# Patient Record
Sex: Female | Born: 1935 | Race: White | Hispanic: No | Smoking: Former smoker
Health system: Southern US, Community
[De-identification: ages and names within clinical notes are randomized; demographics above are authoritative.]

## PROBLEM LIST (undated history)

## (undated) DIAGNOSIS — I10 Essential (primary) hypertension: Secondary | ICD-10-CM

## (undated) HISTORY — PX: CHOLECYSTECTOMY: SHX55

## (undated) HISTORY — PX: ABDOMINAL HYSTERECTOMY: SHX81

## (undated) HISTORY — PX: APPENDECTOMY: SHX54

---

## 2004-05-03 ENCOUNTER — Ambulatory Visit: Payer: Self-pay | Admitting: Internal Medicine

## 2005-06-14 ENCOUNTER — Ambulatory Visit: Payer: Self-pay | Admitting: Internal Medicine

## 2006-06-20 ENCOUNTER — Ambulatory Visit: Payer: Self-pay | Admitting: Internal Medicine

## 2008-05-21 ENCOUNTER — Inpatient Hospital Stay: Payer: Self-pay | Admitting: Internal Medicine

## 2017-07-27 ENCOUNTER — Emergency Department: Payer: Medicare Other

## 2017-07-27 ENCOUNTER — Observation Stay
Admission: EM | Admit: 2017-07-27 | Discharge: 2017-07-30 | Disposition: A | Discharge status: Home / Self Care | Payer: Medicare Other | Attending: Internal Medicine | Admitting: Internal Medicine

## 2017-07-27 DIAGNOSIS — D72829 Elevated white blood cell count, unspecified: Secondary | ICD-10-CM | POA: Insufficient documentation

## 2017-07-27 DIAGNOSIS — R55 Syncope and collapse: Principal | ICD-10-CM

## 2017-07-27 DIAGNOSIS — Z87891 Personal history of nicotine dependence: Secondary | ICD-10-CM | POA: Insufficient documentation

## 2017-07-27 DIAGNOSIS — Z9181 History of falling: Secondary | ICD-10-CM | POA: Diagnosis not present

## 2017-07-27 DIAGNOSIS — Z880 Allergy status to penicillin: Secondary | ICD-10-CM | POA: Insufficient documentation

## 2017-07-27 DIAGNOSIS — R531 Weakness: Secondary | ICD-10-CM | POA: Diagnosis not present

## 2017-07-27 DIAGNOSIS — I10 Essential (primary) hypertension: Secondary | ICD-10-CM

## 2017-07-27 DIAGNOSIS — S0121XA Laceration without foreign body of nose, initial encounter: Secondary | ICD-10-CM | POA: Diagnosis not present

## 2017-07-27 DIAGNOSIS — S022XXA Fracture of nasal bones, initial encounter for closed fracture: Secondary | ICD-10-CM | POA: Diagnosis present

## 2017-07-27 DIAGNOSIS — E876 Hypokalemia: Secondary | ICD-10-CM | POA: Insufficient documentation

## 2017-07-27 DIAGNOSIS — S0003XA Contusion of scalp, initial encounter: Secondary | ICD-10-CM | POA: Diagnosis not present

## 2017-07-27 DIAGNOSIS — Z7982 Long term (current) use of aspirin: Secondary | ICD-10-CM | POA: Insufficient documentation

## 2017-07-27 DIAGNOSIS — W108XXA Fall (on) (from) other stairs and steps, initial encounter: Secondary | ICD-10-CM | POA: Insufficient documentation

## 2017-07-27 DIAGNOSIS — R2681 Unsteadiness on feet: Secondary | ICD-10-CM | POA: Insufficient documentation

## 2017-07-27 DIAGNOSIS — Y92018 Other place in single-family (private) house as the place of occurrence of the external cause: Secondary | ICD-10-CM | POA: Diagnosis not present

## 2017-07-27 DIAGNOSIS — I4519 Other right bundle-branch block: Secondary | ICD-10-CM | POA: Diagnosis not present

## 2017-07-27 DIAGNOSIS — W19XXXA Unspecified fall, initial encounter: Secondary | ICD-10-CM | POA: Diagnosis present

## 2017-07-27 HISTORY — DX: Essential (primary) hypertension: I10

## 2017-07-27 LAB — BASIC METABOLIC PANEL
Anion gap: 8 (ref 5–15)
BUN: 13 mg/dL (ref 6–20)
CALCIUM: 8.4 mg/dL — AB (ref 8.9–10.3)
CO2: 27 mmol/L (ref 22–32)
Chloride: 103 mmol/L (ref 101–111)
Creatinine, Ser: 0.93 mg/dL (ref 0.44–1.00)
GFR calc Af Amer: 60 mL/min — ABNORMAL LOW (ref >60)
GFR calc non Af Amer: 51 mL/min — ABNORMAL LOW (ref >60)
GLUCOSE: 173 mg/dL — AB (ref 65–99)
Potassium: 3.5 mmol/L (ref 3.5–5.1)
Sodium: 138 mmol/L (ref 135–145)

## 2017-07-27 LAB — CBC
HCT: 39.3 % (ref 35.0–47.0)
Hemoglobin: 13.1 g/dL (ref 12.0–16.0)
MCH: 31.5 pg (ref 26.0–34.0)
MCHC: 33.4 g/dL (ref 32.0–36.0)
MCV: 94.4 fL (ref 80.0–100.0)
PLATELETS: 190 10*3/uL (ref 150–440)
RBC: 4.16 MIL/uL (ref 3.80–5.20)
RDW: 14.5 % (ref 11.5–14.5)
WBC: 15.1 10*3/uL — AB (ref 3.6–11.0)

## 2017-07-27 LAB — TROPONIN I: Troponin I: <0.03 ng/mL (ref <0.03)

## 2017-07-27 LAB — GLUCOSE, CAPILLARY: Glucose-Capillary: 139 mg/dL — ABNORMAL HIGH (ref 65–99)

## 2017-07-27 MED ORDER — LABETALOL HCL 100 MG PO TABS: 50.0000 mg | ORAL_TABLET | Freq: Two times a day (BID) | ORAL | 0 refills | Status: DC

## 2017-07-27 MED ORDER — ONDANSETRON HCL 4 MG PO TABS: 4.0000 mg | ORAL_TABLET | Freq: Four times a day (QID) | ORAL | Status: DC | PRN

## 2017-07-27 MED ORDER — ACETAMINOPHEN 650 MG RE SUPP: 650.0000 mg | Freq: Four times a day (QID) | RECTAL | Status: DC | PRN

## 2017-07-27 MED ORDER — DOXYCYCLINE HYCLATE 100 MG PO CAPS: 100.0000 mg | ORAL_CAPSULE | Freq: Two times a day (BID) | ORAL | 0 refills | Status: DC

## 2017-07-27 MED ORDER — ONDANSETRON HCL 4 MG/2ML IJ SOLN: 4.0000 mg | Freq: Four times a day (QID) | INTRAMUSCULAR | Status: DC | PRN

## 2017-07-27 MED ORDER — LABETALOL HCL 100 MG PO TABS
50.0000 mg | ORAL_TABLET | Freq: Once | ORAL | Status: AC
  Administered 2017-07-27: 50 mg via ORAL
  Filled 2017-07-27: qty 0.5

## 2017-07-27 MED ORDER — HYDRALAZINE HCL 20 MG/ML IJ SOLN
10.0000 mg | Freq: Four times a day (QID) | INTRAMUSCULAR | Status: DC | PRN
Start: 2017-07-27 — End: 2017-07-27

## 2017-07-27 MED ORDER — TETANUS-DIPHTH-ACELL PERTUSSIS 5-2.5-18.5 LF-MCG/0.5 IM SUSP
0.5000 mL | Freq: Once | INTRAMUSCULAR | Status: AC
  Administered 2017-07-27: 0.5 mL via INTRAMUSCULAR
  Filled 2017-07-27: qty 0.5

## 2017-07-27 MED ORDER — AMLODIPINE BESYLATE 5 MG PO TABS
2.5000 mg | ORAL_TABLET | Freq: Every day | ORAL | Status: DC
  Administered 2017-07-28: 2.5 mg via ORAL
  Filled 2017-07-27: qty 1

## 2017-07-27 MED ORDER — VITAMIN D 1000 UNITS PO TABS
1000.0000 [IU] | ORAL_TABLET | Freq: Every day | ORAL | Status: DC
  Administered 2017-07-28 – 2017-07-30 (×3): 1000 [IU] via ORAL
  Filled 2017-07-27 (×3): qty 1

## 2017-07-27 MED ORDER — TRAMADOL HCL 50 MG PO TABS: 50.0000 mg | ORAL_TABLET | Freq: Four times a day (QID) | ORAL | Status: DC | PRN

## 2017-07-27 MED ORDER — ENOXAPARIN SODIUM 40 MG/0.4ML ~~LOC~~ SOLN
40.0000 mg | SUBCUTANEOUS | Status: DC
  Administered 2017-07-27 – 2017-07-29 (×3): 40 mg via SUBCUTANEOUS
  Filled 2017-07-27 (×3): qty 0.4

## 2017-07-27 MED ORDER — SODIUM CHLORIDE 0.9 % IV BOLUS (SEPSIS)
500.0000 mL | Freq: Once | INTRAVENOUS | Status: AC
  Administered 2017-07-27: 500 mL via INTRAVENOUS

## 2017-07-27 MED ORDER — LIDOCAINE-EPINEPHRINE (PF) 1 %-1:200000 IJ SOLN
INTRAMUSCULAR | Status: AC
  Filled 2017-07-27: qty 30

## 2017-07-27 MED ORDER — SODIUM CHLORIDE 0.9 % IV SOLN
INTRAVENOUS | Status: AC
  Administered 2017-07-27: 75 mL/h via INTRAVENOUS

## 2017-07-27 MED ORDER — ONDANSETRON 4 MG PO TBDP
ORAL_TABLET | ORAL | Status: AC
  Administered 2017-07-27: 4 mg
  Filled 2017-07-27: qty 1

## 2017-07-27 MED ORDER — ONDANSETRON HCL 4 MG/2ML IJ SOLN
4.0000 mg | Freq: Once | INTRAMUSCULAR | Status: AC
  Administered 2017-07-27: 4 mg via INTRAVENOUS
  Filled 2017-07-27: qty 2

## 2017-07-27 MED ORDER — ACETAMINOPHEN 325 MG PO TABS
650.0000 mg | ORAL_TABLET | Freq: Four times a day (QID) | ORAL | Status: DC | PRN
  Administered 2017-07-27: 650 mg via ORAL
  Filled 2017-07-27: qty 2

## 2017-07-27 MED ORDER — ADULT MULTIVITAMIN W/MINERALS CH
1.0000 | ORAL_TABLET | Freq: Every day | ORAL | Status: DC
  Administered 2017-07-28 – 2017-07-30 (×3): 1 via ORAL
  Filled 2017-07-27 (×3): qty 1

## 2017-07-27 MED ORDER — HYDRALAZINE HCL 20 MG/ML IJ SOLN
5.0000 mg | Freq: Four times a day (QID) | INTRAMUSCULAR | Status: DC | PRN
  Administered 2017-07-28 – 2017-07-29 (×2): 5 mg via INTRAVENOUS
  Filled 2017-07-27 (×2): qty 1

## 2017-07-27 MED ORDER — POLYETHYLENE GLYCOL 3350 17 G PO PACK: 17.0000 g | PACK | Freq: Every day | ORAL | Status: DC | PRN

## 2017-07-27 NOTE — H&P (Signed)
[facility] [place] NAME: Kaitlyn Bauer    MR#:  [id number]  DATE OF BIRTH:  [birth year]  DATE OF ADMISSION:  07/27/2017  PRIMARY CARE PHYSICIAN: Kirk Ruths, MD   REQUESTING/REFERRING PHYSICIAN: Dr. Rudene Re  CHIEF COMPLAINT:   Chief Complaint  Patient presents with  . Fall    HISTORY OF PRESENT ILLNESS:  Kaitlyn Bauer  is a 84 y.o. female with a known history of hypertension not on any medications brought to the hospital secondary to a fall at home. Patient in spite of for his age as being very independent, living by herself. Ambulates fine, was driving the car up until a month ago. Today she was getting out off her porch steps and tripped and had a mechanical fall on her face. No recent fevers or chills. Hasn't been sick recently. No cough or congestion. She was diagnosed with hypertension but has not been on any medications for a while. She was put on one medicine in the past by her PCP but stopped taking it as it was making her feel weak. During her last PCP visit about a couple of years ago her blood pressure systolic was in 381O. Patient denies any headaches or dizziness. Here her systolic blood pressure was in the 200s and she received to oral dose of only 50 mg of labetalol and she was being discharged from ER- as soon as she stood up her BP dropped to 17'P systolic and she almost had a syncopal episode. Now she is alert and back to baseline. Her BP is now back to 102'H systolic. She has a large frontal hematoma and also nasal bridge tear which is sutured.  PAST MEDICAL HISTORY:   Past Medical History:  Diagnosis Date  . Hypertension     PAST SURGICAL HISTORY:   Past Surgical History:  Procedure Laterality Date  . ABDOMINAL HYSTERECTOMY    . APPENDECTOMY    . CHOLECYSTECTOMY      SOCIAL HISTORY:   Social History   Tobacco Use  . Smoking status: Former Research scientist (life sciences)  . Smokeless tobacco: Never Used  Substance  Use Topics  . Alcohol use: No    Frequency: Never    FAMILY HISTORY:   Family History  Family history unknown: Yes    DRUG ALLERGIES:   Allergies  Allergen Reactions  . Penicillins Hives    REVIEW OF SYSTEMS:   Review of Systems  Constitutional: Positive for malaise/fatigue. Negative for chills, fever and weight loss.  HENT: Negative for ear discharge, ear pain, hearing loss, nosebleeds and tinnitus.   Eyes: Positive for blurred vision. Negative for double vision and photophobia.  Respiratory: Negative for cough, hemoptysis, shortness of breath and wheezing.   Cardiovascular: Negative for chest pain, palpitations, orthopnea and leg swelling.  Gastrointestinal: Negative for abdominal pain, constipation, diarrhea, heartburn, melena, nausea and vomiting.  Genitourinary: Negative for dysuria, frequency, hematuria and urgency.  Musculoskeletal: Positive for falls and myalgias. Negative for back pain and neck pain.  Skin: Negative for rash.  Neurological: Negative for dizziness, tingling, tremors, sensory change, speech change, focal weakness and headaches.  Endo/Heme/Allergies: Does not bruise/bleed easily.  Psychiatric/Behavioral: Negative for depression.    MEDICATIONS AT HOME:   Prior to Admission medications   Medication Sig Start Date End Date Taking? Authorizing Provider  aspirin 325 MG EC tablet Take 325 mg by mouth as needed (arthritis).   Yes [provider]  Cholecalciferol (VITAMIN D-1000 MAX ST) 1000 units  tablet Take 1,000 Units by mouth daily.   Yes [provider]  Multiple Vitamin (MULTI-VITAMINS) TABS Take 1 tablet by mouth daily.   Yes [provider]  doxycycline (VIBRAMYCIN) 100 MG capsule Take 1 capsule (100 mg total) by mouth 2 (two) times daily. 07/27/17   Delman Kitten, MD      VITAL SIGNS:  Blood pressure (!) 174/78, pulse 89, temperature 97.7 F (36.5 C), temperature source Oral, resp. rate 17, height 5\' 4"  (1.626 m), weight  63.5 kg (140 lb), SpO2 96 %.  PHYSICAL EXAMINATION:   Physical Exam  GENERAL:  84 y.o.-year-old elderly patient lying in the bed with no acute distress.  EYES: Pupils equal, round, reactive to light and accommodation. No scleral icterus. Extraocular muscles intact.  HEENT: facial trauma- significant bruising and swelling on the face, around the eyes. Sutures on the nasal bridge. Oropharynx and nasopharynx clear.  NECK:  Supple, no jugular venous distention. No thyroid enlargement, no tenderness.  LUNGS: Normal breath sounds bilaterally, no wheezing, rales,rhonchi or crepitation. No use of accessory muscles of respiration.  CARDIOVASCULAR: S1, S2 normal. No  rubs, or gallops. 2/6 systolic murmur present. ABDOMEN: Soft, nontender, nondistended. Bowel sounds present. No organomegaly or mass.  EXTREMITIES: No pedal edema, cyanosis, or clubbing.  NEUROLOGIC: Cranial nerves II through XII are intact. Muscle strength 5/5 in all extremities. Sensation intact. Gait not checked.  PSYCHIATRIC: The patient is alert and oriented x 3.  SKIN: No obvious rash, lesion, or ulcer.   LABORATORY PANEL:   CBC Recent Labs  Lab 07/27/17 1534  WBC 15.1*  HGB 13.1  HCT 39.3  PLT 190   ------------------------------------------------------------------------------------------------------------------  Chemistries  Recent Labs  Lab 07/27/17 1534  NA 138  K 3.5  CL 103  CO2 27  GLUCOSE 173*  BUN 13  CREATININE 0.93  CALCIUM 8.4*   ------------------------------------------------------------------------------------------------------------------  Cardiac Enzymes Recent Labs  Lab 07/27/17 1534  TROPONINI <0.03   ------------------------------------------------------------------------------------------------------------------  RADIOLOGY:  Ct Head Wo Contrast  Result Date: 07/27/2017 CLINICAL DATA:  Fall.  Head injury EXAM: CT HEAD WITHOUT CONTRAST CT MAXILLOFACIAL WITHOUT CONTRAST CT CERVICAL  SPINE WITHOUT CONTRAST TECHNIQUE: Multidetector CT imaging of the head, cervical spine, and maxillofacial structures were performed using the standard protocol without intravenous contrast. Multiplanar CT image reconstructions of the cervical spine and maxillofacial structures were also generated. COMPARISON:  None. FINDINGS: CT HEAD FINDINGS Brain: Generalized atrophy with mild chronic microvascular ischemia. Ventricle size normal. Benign appearing calcification right parietal white matter possibly related to prior infarct or infection. Negative for acute infarct, hemorrhage, or mass Vascular: Negative for hyperdense vessel Skull: Negative for skull fracture. Extensive scalp hematoma anteriorly. Other: None CT MAXILLOFACIAL FINDINGS Osseous: Mildly displaced nasal bone fracture. Nasal septum intact. No other facial fracture identified. Degenerative changes in the TMJ bilaterally. Orbits: Bilateral lens replacement.  Negative for mass lesion. Sinuses: Mild mucosal edema in the maxillary sinus bilaterally without air-fluid level Soft tissues: Extensive scalp hematoma in the frontal region bilaterally. Nasal laceration. CT CERVICAL SPINE FINDINGS Alignment: 3 mm anterolisthesis C6-7.  Mild anterolisthesis C4-5 Skull base and vertebrae: No fracture identified. Soft tissues and spinal canal: Negative Disc levels: Moderate facet degeneration bilaterally C3 through C7. Mild disc degeneration without spinal stenosis. Upper chest: Negative Other: None IMPRESSION: 1. No acute intracranial abnormality. Atrophy and chronic microvascular ischemia. Relatively large frontal scalp hematoma 2. Mildly displaced nasal bone fracture.  No other facial fracture. 3. Cervical spondylosis without fracture Electronically Signed   By: Franchot Gallo  M.D.   On: 07/27/2017 11:01   Ct Cervical Spine Wo Contrast  Result Date: 07/27/2017 CLINICAL DATA:  Fall.  Head injury EXAM: CT HEAD WITHOUT CONTRAST CT MAXILLOFACIAL WITHOUT CONTRAST CT  CERVICAL SPINE WITHOUT CONTRAST TECHNIQUE: Multidetector CT imaging of the head, cervical spine, and maxillofacial structures were performed using the standard protocol without intravenous contrast. Multiplanar CT image reconstructions of the cervical spine and maxillofacial structures were also generated. COMPARISON:  None. FINDINGS: CT HEAD FINDINGS Brain: Generalized atrophy with mild chronic microvascular ischemia. Ventricle size normal. Benign appearing calcification right parietal white matter possibly related to prior infarct or infection. Negative for acute infarct, hemorrhage, or mass Vascular: Negative for hyperdense vessel Skull: Negative for skull fracture. Extensive scalp hematoma anteriorly. Other: None CT MAXILLOFACIAL FINDINGS Osseous: Mildly displaced nasal bone fracture. Nasal septum intact. No other facial fracture identified. Degenerative changes in the TMJ bilaterally. Orbits: Bilateral lens replacement.  Negative for mass lesion. Sinuses: Mild mucosal edema in the maxillary sinus bilaterally without air-fluid level Soft tissues: Extensive scalp hematoma in the frontal region bilaterally. Nasal laceration. CT CERVICAL SPINE FINDINGS Alignment: 3 mm anterolisthesis C6-7.  Mild anterolisthesis C4-5 Skull base and vertebrae: No fracture identified. Soft tissues and spinal canal: Negative Disc levels: Moderate facet degeneration bilaterally C3 through C7. Mild disc degeneration without spinal stenosis. Upper chest: Negative Other: None IMPRESSION: 1. No acute intracranial abnormality. Atrophy and chronic microvascular ischemia. Relatively large frontal scalp hematoma 2. Mildly displaced nasal bone fracture.  No other facial fracture. 3. Cervical spondylosis without fracture Electronically Signed   By: Franchot Gallo M.D.   On: 07/27/2017 11:01   Ct Maxillofacial Wo Contrast  Result Date: 07/27/2017 CLINICAL DATA:  Fall.  Head injury EXAM: CT HEAD WITHOUT CONTRAST CT MAXILLOFACIAL WITHOUT CONTRAST  CT CERVICAL SPINE WITHOUT CONTRAST TECHNIQUE: Multidetector CT imaging of the head, cervical spine, and maxillofacial structures were performed using the standard protocol without intravenous contrast. Multiplanar CT image reconstructions of the cervical spine and maxillofacial structures were also generated. COMPARISON:  None. FINDINGS: CT HEAD FINDINGS Brain: Generalized atrophy with mild chronic microvascular ischemia. Ventricle size normal. Benign appearing calcification right parietal white matter possibly related to prior infarct or infection. Negative for acute infarct, hemorrhage, or mass Vascular: Negative for hyperdense vessel Skull: Negative for skull fracture. Extensive scalp hematoma anteriorly. Other: None CT MAXILLOFACIAL FINDINGS Osseous: Mildly displaced nasal bone fracture. Nasal septum intact. No other facial fracture identified. Degenerative changes in the TMJ bilaterally. Orbits: Bilateral lens replacement.  Negative for mass lesion. Sinuses: Mild mucosal edema in the maxillary sinus bilaterally without air-fluid level Soft tissues: Extensive scalp hematoma in the frontal region bilaterally. Nasal laceration. CT CERVICAL SPINE FINDINGS Alignment: 3 mm anterolisthesis C6-7.  Mild anterolisthesis C4-5 Skull base and vertebrae: No fracture identified. Soft tissues and spinal canal: Negative Disc levels: Moderate facet degeneration bilaterally C3 through C7. Mild disc degeneration without spinal stenosis. Upper chest: Negative Other: None IMPRESSION: 1. No acute intracranial abnormality. Atrophy and chronic microvascular ischemia. Relatively large frontal scalp hematoma 2. Mildly displaced nasal bone fracture.  No other facial fracture. 3. Cervical spondylosis without fracture Electronically Signed   By: Franchot Gallo M.D.   On: 07/27/2017 11:01    EKG:   Orders placed or performed during the hospital encounter of 07/27/17  . ED EKG  . ED EKG  . EKG 12-Lead  . EKG 12-Lead    IMPRESSION  AND PLAN:   Kaitlyn Bauer  is a 84 y.o. female with a known history  of hypertension not on any medications brought to the hospital secondary to a fall at home.  1. Near syncope- in ER, due to drop in BP - gentle hydration, has had high blood pressure and was not on any medications at home. -Low-dose Norvasc started. Monitor and IV low-dose hydralazine as needed -Orthostatic blood pressure changes and carotid ultrasound. -Physical therapy consulted  2. Fall-mechanical fall. Sutures on nasal bridge  -Relatively large frontal scalp hematoma - no vision changes. Hold aspirin. - PT consult  3. Leukocytosis- chronic based on PCP notes Also from the fall Check UA  4. DVT prophylaxis- on lovenox   All the records are reviewed and case discussed with ED provider. Management plans discussed with the patient, family and they are in agreement.  CODE STATUS: Full Code  TOTAL TIME TAKING CARE OF THIS PATIENT: 50 minutes.    Gladstone Lighter M.D on 07/27/2017 at 6:12 PM  Between 7am to 6pm - Pager - [phone]  After 6pm go to [email or web address] - password [email or web address] [facility]  Office  [phone]  CC: Primary care physician; Kirk Ruths, MD

## 2017-07-27 NOTE — ED Triage Notes (Signed)
Patient arrived from home via [facility] c/o fall. Per EMS patient tripped in drive way and fell face first resulting in a laceration on bridge of nose that is actively bleeding. Patient denies LOC. Patient alert and oriented, in no acute distress noted.

## 2017-07-27 NOTE — Discharge Instructions (Addendum)
You were seen in the Emergency Department (ED) today for a head injury.  Based on your evaluation, you may have sustained a concussion (or bruise) to your brain.  If you had a CT scan done, it did not show any evidence of serious injury or bleeding.    Symptoms to expect from a concussion include nausea, mild to moderate headache, difficulty concentrating or sleeping, and mild lightheadedness.  These symptoms should improve over the next few days to weeks, but it may take many weeks before you feel back to normal.  Return to the emergency department or follow-up with your primary care doctor if your symptoms are not improving over this time.  Signs of a more serious head injury include vomiting, severe headache, excessive sleepiness or confusion, and weakness or numbness in your face, arms or legs.  Return immediately to the Emergency Department if you experience any of these more concerning symptoms.

## 2017-07-27 NOTE — ED Provider Notes (Addendum)
[facility] Emergency Department Provider Note   ____________________________________________   First MD Initiated Contact with Patient 07/27/17 [phone]     (approximate)  I have reviewed the triage vital signs and the nursing notes.   HISTORY  Chief Complaint Fall    HPI Kaitlyn Bauer is a 84 y.o. female almost no past medical history.  Reported as healthy, takes aspirin occasionally, but not daily.  Also multivitamins  Patient was walking today back on her steps to get the paper when she tripped on 1 of the steps fell forward striking her face on a concrete.  She reports that she was walking, missed the step and fell directly onto her face.  Denies injury except for her bleeding swelling and pain about the middle of her face and her nose.  EMS reports she had notable bleeding from a laceration over the middle of the upper nose.  No alteration in mental status.  Alert and oriented.  No neck pain.  Denies other injuries.  No chest pain or trouble breathing.  Reports a moderate to severe discomfort and aching pain over the bridge of her nose also her forehead.  Denies visual changes.  No eye pain.  History reviewed. No pertinent past medical history.  There are no active problems to display for this patient.   Past Surgical History:  Procedure Laterality Date  . ABDOMINAL HYSTERECTOMY    . APPENDECTOMY    . CHOLECYSTECTOMY      Prior to Admission medications   Medication Sig Start Date End Date Taking? Authorizing Provider  aspirin 325 MG EC tablet Take 325 mg by mouth as needed (arthritis).   Yes [provider]  Cholecalciferol (VITAMIN D-1000 MAX ST) 1000 units tablet Take 1,000 Units by mouth daily.   Yes [provider]  Multiple Vitamin (MULTI-VITAMINS) TABS Take 1 tablet by mouth daily.   Yes [provider]  labetalol (NORMODYNE) 100 MG tablet Take 0.5 tablets (50 mg total) by mouth 2 (two) times daily. 07/27/17    Delman Kitten, MD  Occasionally aspirin  Allergies Penicillins  Family History  Family history unknown: Yes    Social History Social History   Tobacco Use  . Smoking status: Former Research scientist (life sciences)  . Smokeless tobacco: Never Used  Substance Use Topics  . Alcohol use: No    Frequency: Never  . Drug use: No  Does not smoke drink or use drugs  Review of Systems Constitutional: No fever/chills Eyes: No visual changes. ENT: No sore throat.  See HPI.  Denies pain in the jaw.  Able to open and close the mouth without difficulty.  Denies dental injury.  Denies neck pain or trouble breathing. Cardiovascular: Denies chest pain. Respiratory: Denies shortness of breath. Gastrointestinal: No abdominal pain.  No nausea, no vomiting.   Genitourinary: Negative for dysuria. Musculoskeletal: Negative for back pain. Skin: Negative for rash. Neurological: Negative for headaches, focal weakness or numbness.  Does reports she feels very sore across the front of her face though.    ____________________________________________   PHYSICAL EXAM:  VITAL SIGNS: ED Triage Vitals  Enc Vitals Group     BP      Pulse      Resp      Temp      Temp src      SpO2      Weight      Height      Head Circumference      Peak Flow  Pain Score      Pain Loc      Pain Edu?      Excl. in [place]?     Constitutional: Alert and oriented.  Mildly ill-appearing, appears anxious. Eyes: Conjunctivae are normal. Head: Atraumatic over the posterior lateral aspects of the head, but the anterior face shows a moderate hematoma overlying the mid frontal region, she has bilateral raccoon eyes developing but is able to open and close the eyelids at this time and the conjunctiva appear normal with extraocular movements intact.  She has a small laceration over the bridge of the nose with small arteriolar bleeding.  No septal hematoma.  No bleeding noted from within the nares.  Oropharynx is clear, no loose teeth.  Able to  open and close the jaw without difficulty. Nose: No congestion/rhinnorhea. Mouth/Throat: Mucous membranes are moist. Neck: No stridor.  No midline cervical tenderness. Cardiovascular: Normal rate, regular rhythm. Grossly normal heart sounds.  Good peripheral circulation. Respiratory: Normal respiratory effort.  No retractions. Lungs CTAB. Gastrointestinal: Soft and nontender. No distention. Musculoskeletal: No lower extremity tenderness nor edema. Neurologic:  Normal speech and language. No gross focal neurologic deficits are appreciated.  Skin:  Skin is warm, dry and intact. No rash noted. Psychiatric: Mood and affect are normal. Speech and behavior are normal.  ____________________________________________   LABS (all labs ordered are listed, but only abnormal results are displayed)  Labs Reviewed  GLUCOSE, CAPILLARY - Abnormal; Notable for the following components:      Result Value   Glucose-Capillary 139 (*)    All other components within normal limits  CBG MONITORING, ED   ____________________________________________  EKG   ____________________________________________  RADIOLOGY  Ct Head Wo Contrast  Result Date: 07/27/2017 CLINICAL DATA:  Fall.  Head injury EXAM: CT HEAD WITHOUT CONTRAST CT MAXILLOFACIAL WITHOUT CONTRAST CT CERVICAL SPINE WITHOUT CONTRAST TECHNIQUE: Multidetector CT imaging of the head, cervical spine, and maxillofacial structures were performed using the standard protocol without intravenous contrast. Multiplanar CT image reconstructions of the cervical spine and maxillofacial structures were also generated. COMPARISON:  None. FINDINGS: CT HEAD FINDINGS Brain: Generalized atrophy with mild chronic microvascular ischemia. Ventricle size normal. Benign appearing calcification right parietal white matter possibly related to prior infarct or infection. Negative for acute infarct, hemorrhage, or mass Vascular: Negative for hyperdense vessel Skull: Negative for  skull fracture. Extensive scalp hematoma anteriorly. Other: None CT MAXILLOFACIAL FINDINGS Osseous: Mildly displaced nasal bone fracture. Nasal septum intact. No other facial fracture identified. Degenerative changes in the TMJ bilaterally. Orbits: Bilateral lens replacement.  Negative for mass lesion. Sinuses: Mild mucosal edema in the maxillary sinus bilaterally without air-fluid level Soft tissues: Extensive scalp hematoma in the frontal region bilaterally. Nasal laceration. CT CERVICAL SPINE FINDINGS Alignment: 3 mm anterolisthesis C6-7.  Mild anterolisthesis C4-5 Skull base and vertebrae: No fracture identified. Soft tissues and spinal canal: Negative Disc levels: Moderate facet degeneration bilaterally C3 through C7. Mild disc degeneration without spinal stenosis. Upper chest: Negative Other: None IMPRESSION: 1. No acute intracranial abnormality. Atrophy and chronic microvascular ischemia. Relatively large frontal scalp hematoma 2. Mildly displaced nasal bone fracture.  No other facial fracture. 3. Cervical spondylosis without fracture Electronically Signed   By: Franchot Gallo M.D.   On: 07/27/2017 11:01   Ct Cervical Spine Wo Contrast  Result Date: 07/27/2017 CLINICAL DATA:  Fall.  Head injury EXAM: CT HEAD WITHOUT CONTRAST CT MAXILLOFACIAL WITHOUT CONTRAST CT CERVICAL SPINE WITHOUT CONTRAST TECHNIQUE: Multidetector CT imaging of the head, cervical spine, and maxillofacial  structures were performed using the standard protocol without intravenous contrast. Multiplanar CT image reconstructions of the cervical spine and maxillofacial structures were also generated. COMPARISON:  None. FINDINGS: CT HEAD FINDINGS Brain: Generalized atrophy with mild chronic microvascular ischemia. Ventricle size normal. Benign appearing calcification right parietal white matter possibly related to prior infarct or infection. Negative for acute infarct, hemorrhage, or mass Vascular: Negative for hyperdense vessel Skull: Negative  for skull fracture. Extensive scalp hematoma anteriorly. Other: None CT MAXILLOFACIAL FINDINGS Osseous: Mildly displaced nasal bone fracture. Nasal septum intact. No other facial fracture identified. Degenerative changes in the TMJ bilaterally. Orbits: Bilateral lens replacement.  Negative for mass lesion. Sinuses: Mild mucosal edema in the maxillary sinus bilaterally without air-fluid level Soft tissues: Extensive scalp hematoma in the frontal region bilaterally. Nasal laceration. CT CERVICAL SPINE FINDINGS Alignment: 3 mm anterolisthesis C6-7.  Mild anterolisthesis C4-5 Skull base and vertebrae: No fracture identified. Soft tissues and spinal canal: Negative Disc levels: Moderate facet degeneration bilaterally C3 through C7. Mild disc degeneration without spinal stenosis. Upper chest: Negative Other: None IMPRESSION: 1. No acute intracranial abnormality. Atrophy and chronic microvascular ischemia. Relatively large frontal scalp hematoma 2. Mildly displaced nasal bone fracture.  No other facial fracture. 3. Cervical spondylosis without fracture Electronically Signed   By: Franchot Gallo M.D.   On: 07/27/2017 11:01   Ct Maxillofacial Wo Contrast  Result Date: 07/27/2017 CLINICAL DATA:  Fall.  Head injury EXAM: CT HEAD WITHOUT CONTRAST CT MAXILLOFACIAL WITHOUT CONTRAST CT CERVICAL SPINE WITHOUT CONTRAST TECHNIQUE: Multidetector CT imaging of the head, cervical spine, and maxillofacial structures were performed using the standard protocol without intravenous contrast. Multiplanar CT image reconstructions of the cervical spine and maxillofacial structures were also generated. COMPARISON:  None. FINDINGS: CT HEAD FINDINGS Brain: Generalized atrophy with mild chronic microvascular ischemia. Ventricle size normal. Benign appearing calcification right parietal white matter possibly related to prior infarct or infection. Negative for acute infarct, hemorrhage, or mass Vascular: Negative for hyperdense vessel Skull:  Negative for skull fracture. Extensive scalp hematoma anteriorly. Other: None CT MAXILLOFACIAL FINDINGS Osseous: Mildly displaced nasal bone fracture. Nasal septum intact. No other facial fracture identified. Degenerative changes in the TMJ bilaterally. Orbits: Bilateral lens replacement.  Negative for mass lesion. Sinuses: Mild mucosal edema in the maxillary sinus bilaterally without air-fluid level Soft tissues: Extensive scalp hematoma in the frontal region bilaterally. Nasal laceration. CT CERVICAL SPINE FINDINGS Alignment: 3 mm anterolisthesis C6-7.  Mild anterolisthesis C4-5 Skull base and vertebrae: No fracture identified. Soft tissues and spinal canal: Negative Disc levels: Moderate facet degeneration bilaterally C3 through C7. Mild disc degeneration without spinal stenosis. Upper chest: Negative Other: None IMPRESSION: 1. No acute intracranial abnormality. Atrophy and chronic microvascular ischemia. Relatively large frontal scalp hematoma 2. Mildly displaced nasal bone fracture.  No other facial fracture. 3. Cervical spondylosis without fracture Electronically Signed   By: Franchot Gallo M.D.   On: 07/27/2017 11:01    CT reviewed, no acute intracranial lab ability.  Scalp hematoma present.  Displaced nasal bone fracture.  No cervical fractures ____________________________________________   PROCEDURES  Procedure(s) performed: None  Procedures  Critical Care performed: No  ____________________________________________   INITIAL IMPRESSION / ASSESSMENT AND PLAN / ED COURSE  Pertinent labs & imaging results that were available during my care of the patient were reviewed by me and considered in my medical decision making (see chart for details).    Clinical Course as of Jul 27 1412  Thu Jul 27, 2017  4332 Hemostasis achieved over the  nose wound with Surgicel.  [MQ]  1302 Dr. Pryor Ochoa repairing nose.  [MQ]  1406 BP 178/79. Alert, no concerns. Return precautions and treatment  recommendations and follow-up discussed with the patient who is agreeable with the plan.   [MQ]    Clinical Course User Index [MQ] Delman Kitten, MD   ----------------------------------------- 12:27 PM on 07/27/2017 -----------------------------------------  Removed hemostatic dressing, further evaluate the patient's nose and repair laceration however noted that appears to be a small vessel underneath the surface that is pulsating and bleeding.  Difficult to control the bleeding, discussed with Dr. Pryor Ochoa of ENT and he is amenable to coming to see the patient for further evaluation regarding bleeding and nasal bone fracture management.  Patient resting [place] this time, blood pressure is improved slightly but is notably elevated she reports that she has a long history of high blood pressure but is not on any medication for several years.  Denies chest pain, denies headache, denies any trouble breathing.  No vision changes.  ____________________________________________   FINAL CLINICAL IMPRESSION(S) / ED DIAGNOSES  Final diagnoses:  Hypertension, unspecified type  Closed fracture of nasal bone, initial encounter      NEW MEDICATIONS STARTED DURING THIS VISIT:  This SmartLink is deprecated. Use AVSMEDLIST instead to display the medication list for a patient.   Note:  This document was prepared using Dragon voice recognition software and may include unintentional dictation errors.     Delman Kitten, MD 07/27/17 1414  ----------------------------------------- 3:56 PM on 07/27/2017 -----------------------------------------  Patient got up to use the bathroom/commode, having a bowel movement after she got up she felt very lightheaded and almost passed out.  She be assisted back to bed and her blood pressure had dropped and she felt nauseated.  She feels much better now, she is resting comfortably in the bed awake alert blood pressure 170/76.  I suspect the combination of the low-dose  labetalol plus perhaps the cathartic notes of the blood that she swallowed from her nasal fracture may have led to her feeling lightheaded possibly having a vasovagal orthostatic type symptoms.  She denies any chest pain no headache, her nausea is improved.  At the present time are giving her 500 mL fluid bolus, checking basic labs, for lab work including chemistry and troponin appear unremarkable the plan will be to reevaluate her after fluid bolus and if she is able to ambulate without any near syncopal symptoms or drop in her blood pressure that she could be home discharge with family.  If she has another episode of near syncope, ongoing nausea, vomiting, or other new concerns arise likely admit her to the hospital.  Ongoing care assigned to Dr. Alfred Levins with plan above    Delman Kitten, MD 07/27/17 1557

## 2017-07-27 NOTE — ED Notes (Signed)
ED Provider at bedside. 

## 2017-07-27 NOTE — Consult Note (Signed)
..   Kaitlyn Bauer, Kaitlyn Bauer [id number] November 18, 1921 Delman Kitten, MD  Reason for Consult: facial laceration, nasal fracture  HPI: 84 y.o. Female presented to ER for facial laceration and nasal fracture following fal.  Consulted due to concern for bleeding and laceration.  Patient reports fell on step.    Patient reports facial pain and swelling, bleeding, nasal obstruction.  Denies any LOC.  Other ENT related ROS negative.  Allergies:  Allergies  Allergen Reactions  . Penicillins Hives    ROS: Review of systems normal other than 12 systems except per HPI.  PMH: History reviewed. No pertinent past medical history.  FH:  Family History  Family history unknown: Yes    SH:  Social History   Socioeconomic History  . Marital status: Widowed    Spouse name: Not on file  . Number of children: Not on file  . Years of education: Not on file  . Highest education level: Not on file  Social Needs  . Financial resource strain: Not on file  . Food insecurity - worry: Not on file  . Food insecurity - inability: Not on file  . Transportation needs - medical: Not on file  . Transportation needs - non-medical: Not on file  Occupational History  . Not on file  Tobacco Use  . Smoking status: Former Research scientist (life sciences)  . Smokeless tobacco: Never Used  Substance and Sexual Activity  . Alcohol use: No    Frequency: Never  . Drug use: No  . Sexual activity: Not on file  Other Topics Concern  . Not on file  Social History Narrative  . Not on file    PSH:  Past Surgical History:  Procedure Laterality Date  . ABDOMINAL HYSTERECTOMY    . APPENDECTOMY    . CHOLECYSTECTOMY      Physical  Exam: GEN-  NAD, supine in bed, face dressed with gauze NEURO-CN 2-12 grossly intact and symmetric. EARS- EAC/TMs normal BL.  OC/OP-Oral cavity, lips, gums, ororpharynx normal with no masses or lesions. Skin warm and dry. NOSE-  signficiant stellate laceration with active bleeding from nasal root. FACE-  Significant  bilateral infraorbital ecchymosis NECK-  No LAD, no masses or lesions.  CT scan reviewed-  Frontal hematoma, nasal bone fracture  Procedure-  Intermediate facial laceration closure of nose 4cm.  After verbal consent obtained, the patient was injected with 84ml of 1%lidocaine with 1/100,000 epinephrine into her nasal bridge.  Present dressing was removed from patient's nasal bridge.  Brisk bright red bleeding was noted.  A deep vicryl suture was placed around blood vessel and reapproximated deep tissues and bleeding substantially improved.  A second deep vicryl stitch was placed for further reapproximation of deep tissues.  The skin was next closed in interupted fashion with 5.0 Nylon suture.  Patient tolerated procedure well.  A/P: Facial laceration s/p repair.  Nasal bone fracture.    Plan:  Oral abx (keflex if PCN allergy rash).  Bacitracin ointment to incision TID.  Follow up in 1 week.  Will discuss if treatment of nasal bone fracture needed at that time but discused most likely will be observation.  Emilyn Ruble 07/27/2017 1:12 PM

## 2017-07-27 NOTE — ED Notes (Signed)
Patient blood pressure elevated, MD made aware.

## 2017-07-27 NOTE — ED Provider Notes (Signed)
EKG reviewed and are by me at 1550 Heart rate 80 QRS 100 QTC 450 Normal sinus rhythm, no evidence of acute ischemia.  Right bundle branch block.  Some incomplete right bundle branch block morphology   Delman Kitten, MD 07/27/17 1558

## 2017-07-27 NOTE — ED Notes (Signed)
Report called to nessa rn floor nurse

## 2017-07-27 NOTE — ED Notes (Signed)
Pt resting quietly.  Family with pt.  nsr on monitor.

## 2017-07-28 ENCOUNTER — Observation Stay: Payer: Medicare Other

## 2017-07-28 ENCOUNTER — Other Ambulatory Visit: Payer: Self-pay

## 2017-07-28 LAB — BASIC METABOLIC PANEL WITH GFR
Anion gap: 7 (ref 5–15)
BUN: 10 mg/dL (ref 6–20)
CO2: 26 mmol/L (ref 22–32)
Calcium: 8.1 mg/dL — ABNORMAL LOW (ref 8.9–10.3)
Chloride: 107 mmol/L (ref 101–111)
Creatinine, Ser: 0.87 mg/dL (ref 0.44–1.00)
GFR calc Af Amer: >60 mL/min
GFR calc non Af Amer: 56 mL/min — ABNORMAL LOW
Glucose, Bld: 105 mg/dL — ABNORMAL HIGH (ref 65–99)
Potassium: 3.2 mmol/L — ABNORMAL LOW (ref 3.5–5.1)
Sodium: 140 mmol/L (ref 135–145)

## 2017-07-28 LAB — CBC
HCT: 34.8 % — ABNORMAL LOW (ref 35.0–47.0)
Hemoglobin: 11.8 g/dL — ABNORMAL LOW (ref 12.0–16.0)
MCH: 32.1 pg (ref 26.0–34.0)
MCHC: 33.9 g/dL (ref 32.0–36.0)
MCV: 94.6 fL (ref 80.0–100.0)
Platelets: 169 K/uL (ref 150–440)
RBC: 3.68 MIL/uL — ABNORMAL LOW (ref 3.80–5.20)
RDW: 14.1 % (ref 11.5–14.5)
WBC: 11.6 K/uL — ABNORMAL HIGH (ref 3.6–11.0)

## 2017-07-28 MED ORDER — AMLODIPINE BESYLATE 5 MG PO TABS: 5.0000 mg | ORAL_TABLET | Freq: Every day | ORAL | Status: DC

## 2017-07-28 NOTE — Progress Notes (Signed)
[facility] Address: [address] [place], [place] [address]  Phone: [phone]  PCP: Frazier Richards   Florene Glen 07/28/2017, 11:52 AM

## 2017-07-28 NOTE — Progress Notes (Signed)
[place] [facility] [place] NAME: Kaitlyn Bauer    MR#:  [id number]  DATE OF BIRTH:  January 14, 1922  SUBJECTIVE:  CHIEF COMPLAINT: Patient is resting comfortably.  Denies any dizziness.  Out of bed to chair.  Denies any headache or blurry vision.  Significantly orthostatic.  Grandson at bedside  REVIEW OF SYSTEMS:  CONSTITUTIONAL: No fever, fatigue or weakness.  EYES: No blurred or double vision.  EARS, NOSE, AND THROAT: No tinnitus or ear pain.  RESPIRATORY: No cough, shortness of breath, wheezing or hemoptysis.  CARDIOVASCULAR: No chest pain, orthopnea, edema.  GASTROINTESTINAL: No nausea, vomiting, diarrhea or abdominal pain.  GENITOURINARY: No dysuria, hematuria.  ENDOCRINE: No polyuria, nocturia,  HEMATOLOGY: No anemia, easy bruising or bleeding SKIN: No rash or lesion. MUSCULOSKELETAL: No joint pain or arthritis.   NEUROLOGIC: No tingling, numbness, weakness.  PSYCHIATRY: No anxiety or depression.   DRUG ALLERGIES:   Allergies  Allergen Reactions  . Penicillins Hives    VITALS:  Blood pressure (!) 140/109, pulse (!) 106, temperature 98.4 F (36.9 C), temperature source Oral, resp. rate 16, height 5\' 4"  (1.626 m), weight 63.5 kg (140 lb), SpO2 93 %.  PHYSICAL EXAMINATION:  GENERAL:  84 y.o.-year-old patient lying in the bed with no acute distress.  EYES: Pupils equal, round, reactive to light and accommodation. No scleral icterus. Extraocular muscles intact.  HEENT: Head atraumatic, normocephalic. Oropharynx and nasopharynx clear.  Nasal plate with sutures.  Multiple bruises noticed on the face NECK:  Supple, no jugular venous distention. No thyroid enlargement, no tenderness.  LUNGS: Normal breath sounds bilaterally, no wheezing, rales,rhonchi or crepitation. No use of accessory muscles of respiration.  CARDIOVASCULAR: S1, S2 normal. No murmurs, rubs, or gallops.  ABDOMEN: Soft, nontender, nondistended. Bowel sounds present. No  organomegaly or mass.  EXTREMITIES: No pedal edema, cyanosis, or clubbing.  NEUROLOGIC: Cranial nerves II through XII are intact. Muscle strength 5/5 in all extremities. Sensation intact. Gait not checked.  PSYCHIATRIC: The patient is alert and oriented x 3.  SKIN: No obvious rash, lesion, or ulcer.    LABORATORY PANEL:   CBC Recent Labs  Lab 07/28/17 0431  WBC 11.6*  HGB 11.8*  HCT 34.8*  PLT 169   ------------------------------------------------------------------------------------------------------------------  Chemistries  Recent Labs  Lab 07/28/17 0431  NA 140  K 3.2*  CL 107  CO2 26  GLUCOSE 105*  BUN 10  CREATININE 0.87  CALCIUM 8.1*   ------------------------------------------------------------------------------------------------------------------  Cardiac Enzymes Recent Labs  Lab 07/27/17 1534  TROPONINI <0.03   ------------------------------------------------------------------------------------------------------------------  RADIOLOGY:  Ct Head Wo Contrast  Result Date: 07/27/2017 CLINICAL DATA:  Fall.  Head injury EXAM: CT HEAD WITHOUT CONTRAST CT MAXILLOFACIAL WITHOUT CONTRAST CT CERVICAL SPINE WITHOUT CONTRAST TECHNIQUE: Multidetector CT imaging of the head, cervical spine, and maxillofacial structures were performed using the standard protocol without intravenous contrast. Multiplanar CT image reconstructions of the cervical spine and maxillofacial structures were also generated. COMPARISON:  None. FINDINGS: CT HEAD FINDINGS Brain: Generalized atrophy with mild chronic microvascular ischemia. Ventricle size normal. Benign appearing calcification right parietal white matter possibly related to prior infarct or infection. Negative for acute infarct, hemorrhage, or mass Vascular: Negative for hyperdense vessel Skull: Negative for skull fracture. Extensive scalp hematoma anteriorly. Other: None CT MAXILLOFACIAL FINDINGS Osseous: Mildly displaced nasal bone  fracture. Nasal septum intact. No other facial fracture identified. Degenerative changes in the TMJ bilaterally. Orbits: Bilateral lens replacement.  Negative for mass lesion. Sinuses: Mild mucosal edema in the  maxillary sinus bilaterally without air-fluid level Soft tissues: Extensive scalp hematoma in the frontal region bilaterally. Nasal laceration. CT CERVICAL SPINE FINDINGS Alignment: 3 mm anterolisthesis C6-7.  Mild anterolisthesis C4-5 Skull base and vertebrae: No fracture identified. Soft tissues and spinal canal: Negative Disc levels: Moderate facet degeneration bilaterally C3 through C7. Mild disc degeneration without spinal stenosis. Upper chest: Negative Other: None IMPRESSION: 1. No acute intracranial abnormality. Atrophy and chronic microvascular ischemia. Relatively large frontal scalp hematoma 2. Mildly displaced nasal bone fracture.  No other facial fracture. 3. Cervical spondylosis without fracture Electronically Signed   By: Franchot Gallo M.D.   On: 07/27/2017 11:01   Ct Cervical Spine Wo Contrast  Result Date: 07/27/2017 CLINICAL DATA:  Fall.  Head injury EXAM: CT HEAD WITHOUT CONTRAST CT MAXILLOFACIAL WITHOUT CONTRAST CT CERVICAL SPINE WITHOUT CONTRAST TECHNIQUE: Multidetector CT imaging of the head, cervical spine, and maxillofacial structures were performed using the standard protocol without intravenous contrast. Multiplanar CT image reconstructions of the cervical spine and maxillofacial structures were also generated. COMPARISON:  None. FINDINGS: CT HEAD FINDINGS Brain: Generalized atrophy with mild chronic microvascular ischemia. Ventricle size normal. Benign appearing calcification right parietal white matter possibly related to prior infarct or infection. Negative for acute infarct, hemorrhage, or mass Vascular: Negative for hyperdense vessel Skull: Negative for skull fracture. Extensive scalp hematoma anteriorly. Other: None CT MAXILLOFACIAL FINDINGS Osseous: Mildly displaced nasal  bone fracture. Nasal septum intact. No other facial fracture identified. Degenerative changes in the TMJ bilaterally. Orbits: Bilateral lens replacement.  Negative for mass lesion. Sinuses: Mild mucosal edema in the maxillary sinus bilaterally without air-fluid level Soft tissues: Extensive scalp hematoma in the frontal region bilaterally. Nasal laceration. CT CERVICAL SPINE FINDINGS Alignment: 3 mm anterolisthesis C6-7.  Mild anterolisthesis C4-5 Skull base and vertebrae: No fracture identified. Soft tissues and spinal canal: Negative Disc levels: Moderate facet degeneration bilaterally C3 through C7. Mild disc degeneration without spinal stenosis. Upper chest: Negative Other: None IMPRESSION: 1. No acute intracranial abnormality. Atrophy and chronic microvascular ischemia. Relatively large frontal scalp hematoma 2. Mildly displaced nasal bone fracture.  No other facial fracture. 3. Cervical spondylosis without fracture Electronically Signed   By: Franchot Gallo M.D.   On: 07/27/2017 11:01   US Carotid Bilateral  Result Date: 07/28/2017 CLINICAL DATA:  Syncope EXAM: BILATERAL CAROTID DUPLEX ULTRASOUND TECHNIQUE: Pearline Cables scale imaging, color Doppler and duplex ultrasound were performed of bilateral carotid and vertebral arteries in the neck. COMPARISON:  None. FINDINGS: Criteria: Quantification of carotid stenosis is based on velocity parameters that correlate the residual internal carotid diameter with NASCET-based stenosis levels, using the diameter of the distal internal carotid lumen as the denominator for stenosis measurement. The following velocity measurements were obtained: RIGHT ICA:  71 cm/sec CCA:  95 cm/sec SYSTOLIC ICA/CCA RATIO:  0.8 DIASTOLIC ICA/CCA RATIO:  0.8 ECA:  90 cm/sec LEFT ICA:  82 cm/sec CCA:  95 cm/sec SYSTOLIC ICA/CCA RATIO:  0.9 DIASTOLIC ICA/CCA RATIO:  1.1 ECA:  103 cm/sec RIGHT CAROTID ARTERY: Little if any plaque in the bulb. Low resistance internal carotid Doppler pattern. RIGHT  VERTEBRAL ARTERY:  Antegrade. LEFT CAROTID ARTERY: Little if any plaque in the bulb. Low resistance internal carotid Doppler pattern. LEFT VERTEBRAL ARTERY:  Antegrade. IMPRESSION: Less than 50% stenosis in the right and left internal carotid arteries. Electronically Signed   By: Marybelle Killings M.D.   On: 07/28/2017 08:21   Ct Maxillofacial Wo Contrast  Result Date: 07/27/2017 CLINICAL DATA:  Fall.  Head injury EXAM: CT  HEAD WITHOUT CONTRAST CT MAXILLOFACIAL WITHOUT CONTRAST CT CERVICAL SPINE WITHOUT CONTRAST TECHNIQUE: Multidetector CT imaging of the head, cervical spine, and maxillofacial structures were performed using the standard protocol without intravenous contrast. Multiplanar CT image reconstructions of the cervical spine and maxillofacial structures were also generated. COMPARISON:  None. FINDINGS: CT HEAD FINDINGS Brain: Generalized atrophy with mild chronic microvascular ischemia. Ventricle size normal. Benign appearing calcification right parietal white matter possibly related to prior infarct or infection. Negative for acute infarct, hemorrhage, or mass Vascular: Negative for hyperdense vessel Skull: Negative for skull fracture. Extensive scalp hematoma anteriorly. Other: None CT MAXILLOFACIAL FINDINGS Osseous: Mildly displaced nasal bone fracture. Nasal septum intact. No other facial fracture identified. Degenerative changes in the TMJ bilaterally. Orbits: Bilateral lens replacement.  Negative for mass lesion. Sinuses: Mild mucosal edema in the maxillary sinus bilaterally without air-fluid level Soft tissues: Extensive scalp hematoma in the frontal region bilaterally. Nasal laceration. CT CERVICAL SPINE FINDINGS Alignment: 3 mm anterolisthesis C6-7.  Mild anterolisthesis C4-5 Skull base and vertebrae: No fracture identified. Soft tissues and spinal canal: Negative Disc levels: Moderate facet degeneration bilaterally C3 through C7. Mild disc degeneration without spinal stenosis. Upper chest: Negative  Other: None IMPRESSION: 1. No acute intracranial abnormality. Atrophy and chronic microvascular ischemia. Relatively large frontal scalp hematoma 2. Mildly displaced nasal bone fracture.  No other facial fracture. 3. Cervical spondylosis without fracture Electronically Signed   By: Franchot Gallo M.D.   On: 07/27/2017 11:01    EKG:   Orders placed or performed during the hospital encounter of 07/27/17  . ED EKG  . ED EKG  . EKG 12-Lead  . EKG 12-Lead    ASSESSMENT AND PLAN:   Charae Depaolis  is a 84 y.o. female with a known history of hypertension not on any medications brought to the hospital secondary to a fall at home.  1. Near syncope-secondary to orthostatic hypotension -Provide gentle hydration and monitor blood pressure closely Repeat orthostatics- -carotid Dopplers with less than 50% stenosis. CT scan with frontal scalp hematoma -Low-dose Norvasc started. Monitor and IV low-dose hydralazine as needed -Orthostatic blood pressure  -Physical therapy consulted-recommending home health PT  2. Fall-mechanical fall. Sutures on nasal bridge  -Relatively large frontal scalp hematoma - no vision changes. Hold aspirin.  3. Leukocytosis- chronic based on PCP notes Also from the fall White blood cell count is trending down 15.1-11.6  4.  Hypokalemia replete potassium Recheck in a.m.  4. DVT prophylaxis- on lovenox       All the records are reviewed and case discussed with Care Management/Social Workerr. Management plans discussed with the patient, family and they are in agreement.  CODE STATUS: fc   TOTAL TIME TAKING CARE OF THIS PATIENT: 35 minutes.   POSSIBLE D/C IN 1 DAYS, DEPENDING ON CLINICAL CONDITION.  Note: This dictation was prepared with Dragon dictation along with smaller phrase technology. Any transcriptional errors that result from this process are unintentional.   Nicholes Mango M.D on 07/28/2017 at 3:49 PM  Between 7am to 6pm - Pager -  [phone] After 6pm go to [email or web address] - password [email or web address] [facility]  [facility]  Office  [phone]  CC: Primary care physician; Kirk Ruths, MD

## 2017-07-28 NOTE — Care Management Note (Addendum)
Case Management Note  Patient Details  Name: Kaitlyn Bauer MRN: [id number] Date of Birth: 1922-04-11  Subjective/Objective:  PT recommending home health PT. Patient agreeable with no agency preference. Referral to [facility] for HHPT. No DME needs.  Patient will be able to start PT after she  is seen by her PCP. Corene Cornea with [facility] in to talk with patient and granddaughter                    Action/Plan: [facility] for HHPT. Granddaughter at bedside and is also agreeable to POC.  Expected Discharge Date:                  Expected Discharge Plan:  [place]  In-House Referral:     Discharge planning Services  CM Consult  Post Acute Care Choice:  Home Health Choice offered to:  Patient  DME Arranged:    DME Agency:     HH Arranged:  RN [place] Agency:  [facility]  Status of Service:  Completed, signed off  If discussed at [place] of Stay Meetings, dates discussed:    Additional Comments:  Jolly Mango, RN 07/28/2017, 11:39 AM

## 2017-07-28 NOTE — Progress Notes (Signed)
New Admission Note:   Arrival Method: per stretcher from ED. Pt came from home after a fall Mental Orientation: alert and oriented X4, forgetful but can be easily reoriented Telemetry: placed on telebox [id number], CCMD notified, verified with Olivia Mackie, RN Assessment: Completed Skin: warm, dry, with ecchymosis noted surrounding both eyes, on the nose and face. Laceration noted on the nose, sutured in ED, abrasions on the nose. Scattered bruises also noted on both arms and knees. Prophylactic sacral foam dressing applied. IV: G22 on the left AC with transparent dressing, intact Pain: pt stated pain is "tolerable" as of this time. Will administer pain medicine once needed. Safety Measures: Safety Fall Prevention Plan has been given and discussed Admission: Completed 1A Orientation: Patient has been oriented to the room, unit and staff.  Family: daughter [name] at bedside  Orders have been reviewed and implemented. Fall risk and allergy wristbands applied, pt on a low bed, yellow socks on, call light has been placed within reach and bed alarm activated. Will continue to monitor patient.  Georgeanna Harrison, RN

## 2017-07-28 NOTE — Care Management Obs Status (Signed)
[place] NOTIFICATION   Patient Details  Name: Kaitlyn Bauer MRN: [id number] Date of Birth: 08-14-1921   Medicare Observation Status Notification Given:  Yes    Jolly Mango, RN 07/28/2017, 9:42 AM

## 2017-07-28 NOTE — Progress Notes (Addendum)
Physical Therapy Evaluation Patient Details Name: Kaitlyn Bauer MRN: [id number] DOB: 12/02/1921 Today's Date: 07/28/2017   History of Present Illness  Kaitlyn Bauer  is a 84 y.o. female with a known history of hypertension not on any medications brought to the hospital secondary to a fall at home. Patient in spite of for his age as being very independent, living by herself. Ambulates fine, was driving the car up until a month ago. Today she was getting out off her porch steps and tripped and had a mechanical fall on her face. No recent fevers or chills. Hasn't been sick recently. No cough or congestion. She was diagnosed with hypertension but has not been on any medications for a while. She was put on one medicine in the past by her PCP but stopped taking it as it was making her feel weak. During her last PCP visit about a couple of years ago her blood pressure systolic was in 409W. Patient denies any headaches or dizziness. Here her systolic blood pressure was in the 200s and she received to oral dose of only 50 mg of labetalol and she was being discharged from ER- as soon as she stood up her BP dropped to 11'B systolic and she almost had a syncopal episode. Now she is alert and back to baseline. Her BP is now back to 147'W systolic. She has a large frontal hematoma and also nasal bridge tear which is sutured.  Clinical Impression  Pt admitted with above diagnosis. Pt currently with functional limitations due to the deficits listed below (see PT Problem List).  Good speed/sequencing with bed mobility. HOB flat, no bed rails. Safe hand placement, good speed, sequencing, and stability with transfers. Pt is able to ambulate a full lap around RN station with therapist. She demonstrates some mild slowing of speed and lateral gait deviation with horizontal and vertical head turns. Gait speed is overall decreased but age appropriate and sufficient for limited community mobility. She is able to self-correct for all  gait deviation without overt LOB. VSS with ambulation. She is able to place her feet together without UE support and maintain position however she loses balance when closing eyes. Unable to achieve or maintain tandem stance and single leg balance is approximately 1s on both LEs. She would benefit from PT to work on her balance. Orthostatic vitals repeated and pt is significantly hypertensive in supine. She also has significant drop in systolic readings from sit to stand however is asymptomatic. Recommend HH PT due to concerns over transportation however OP PT would be better if this is an option for her. Pt will benefit from PT services to address deficits in strength, balance, and mobility in order to return to full function at home.       Follow Up Recommendations Home health PT;Other (comment)(If pt has transportation OP PT would be better)    Equipment Recommendations  None recommended by PT;Other (comment)(Encouraged to use single point cane as needed)    Recommendations for Other Services       Precautions / Restrictions Precautions Precautions: Fall Restrictions Weight Bearing Restrictions: No      Mobility  Bed Mobility Overal bed mobility: Independent             General bed mobility comments: Good speed/sequencing. No deficits. HOB flat, no bed rails  Transfers Overall transfer level: Needs assistance Equipment used: None Transfers: Sit to/from Stand Sit to Stand: Supervision         General transfer comment: Safe  hand placement, good speed, sequencing, and stability. No deficits  Ambulation/Gait Ambulation/Gait assistance: Min guard Ambulation Distance (Feet): 200 Feet Assistive device: None Gait Pattern/deviations: Decreased step length - right;Decreased step length - left     General Gait Details: Pt able to ambulate a full lap around RN station with therapist. She demonstrates some mild slowing of speed and lateral gait deviation with horizontal and  vertical head turns. Gait speed is decreased but age appropriate and sufficient for limited community mobility. She is able to self-correct for all gait deviation without overt LOB. VSS with ambulation   Stairs            Wheelchair Mobility    Modified Rankin (Stroke Patients Only)       Balance Overall balance assessment: Needs assistance Sitting-balance support: No upper extremity supported Sitting balance-Leahy Scale: Good     Standing balance support: No upper extremity supported Standing balance-Leahy Scale: Fair Standing balance comment: Pt able to place feet together without UE support and maintain. Positive Rhomberg. Unable to achieve or maintain tandem stance and single leg balance is approximately 1s. History of current fall that sent her to hospital but no others                             Pertinent Vitals/Pain Pain Assessment: No/denies pain    Home Living Family/patient expects to be discharged to:: Private residence Living Arrangements: Alone Available Help at Discharge: Family Type of Home: House Home Access: Stairs to enter Entrance Stairs-Rails: Can reach both Entrance Stairs-Number of Steps: 3 Home Layout: Two level;Able to live on main level with bedroom/bathroom;Laundry or work area in [place]: Environmental consultant - 4 wheels;Cane - single point;Crutches(no BSC, wc, or 2 wheeled walker)      Prior Function Level of Independence: Independent         Comments: Pt is independent with ADLs and mostly independent with ADLs. No assistive device for ambulation. No other falls in the last 12 months. Family driving until recently. Family now assisting with transportation     Hand Dominance   Dominant Hand: Right    Extremity/Trunk Assessment   Upper Extremity Assessment Upper Extremity Assessment: Overall WFL for tasks assessed    Lower Extremity Assessment Lower Extremity Assessment: Overall WFL for tasks assessed        Communication   Communication: No difficulties  Cognition Arousal/Alertness: Awake/alert Behavior During Therapy: WFL for tasks assessed/performed Overall Cognitive Status: Within Functional Limits for tasks assessed                                        General Comments      Exercises     Assessment/Plan    PT Assessment Patient needs continued PT services  PT Problem List Decreased strength;Decreased balance;Decreased mobility       PT Treatment Interventions DME instruction;Gait training;Stair training;Therapeutic activities;Therapeutic exercise;Balance training;Neuromuscular re-education    PT Goals (Current goals can be found in the Care Plan section)  Acute Rehab PT Goals Patient Stated Goal: Return to prior function at home PT Goal Formulation: With patient Time For Goal Achievement: 08/11/17 Potential to Achieve Goals: Good    Frequency Min 2X/week   Barriers to discharge Decreased caregiver support Lives alone but has good support from family    Co-evaluation  AM-PAC PT "6 Clicks" Daily Activity  Outcome Measure Difficulty turning over in bed (including adjusting bedclothes, sheets and blankets)?: None Difficulty moving from lying on back to sitting on the side of the bed? : None Difficulty sitting down on and standing up from a chair with arms (e.g., wheelchair, bedside commode, etc,.)?: None Help needed moving to and from a bed to chair (including a wheelchair)?: None Help needed walking in hospital room?: A Little Help needed climbing 3-5 steps with a railing? : A Little 6 Click Score: 22    End of Session Equipment Utilized During Treatment: Gait belt Activity Tolerance: Patient tolerated treatment well Patient left: in chair;with call bell/phone within reach;with chair alarm set Nurse Communication: Mobility status PT Visit Diagnosis: Unsteadiness on feet (R26.81);History of falling (Z91.81)    Time:  5374-8270 PT Time Calculation (min) (ACUTE ONLY): 30 min   Charges:   PT Evaluation $PT Eval Low Complexity: 1 Low PT Treatments $Therapeutic Activity: 8-22 mins   PT G Codes:        Lyndel Safe Stormey Wilborn PT, DPT    Jamika Sadek 07/28/2017, 11:34 AM

## 2017-07-28 NOTE — Care Management (Signed)
Met with patient to go over observation notice. Patient lives alone. Prior to admission, patient was independent, active and used no DME. She took no medications. She used to be a Marine scientist. She has a large family with medical background and they are all very supportive. Kaitlyn Bauer is PCP.seen Per Epic, patient last seen 2 years ago. She does have any DME she may need. She has a walker, cane and bedside commode.

## 2017-07-29 LAB — BASIC METABOLIC PANEL
ANION GAP: 6 (ref 5–15)
BUN: 13 mg/dL (ref 6–20)
CALCIUM: 8 mg/dL — AB (ref 8.9–10.3)
CHLORIDE: 105 mmol/L (ref 101–111)
CO2: 28 mmol/L (ref 22–32)
Creatinine, Ser: 0.91 mg/dL (ref 0.44–1.00)
GFR calc non Af Amer: 53 mL/min — ABNORMAL LOW (ref >60)
GLUCOSE: 116 mg/dL — AB (ref 65–99)
Potassium: 3.3 mmol/L — ABNORMAL LOW (ref 3.5–5.1)
Sodium: 139 mmol/L (ref 135–145)

## 2017-07-29 LAB — MAGNESIUM: Magnesium: 2 mg/dL (ref 1.7–2.4)

## 2017-07-29 MED ORDER — AMLODIPINE BESYLATE 10 MG PO TABS
10.0000 mg | ORAL_TABLET | Freq: Every day | ORAL | Status: DC
  Administered 2017-07-29 – 2017-07-30 (×2): 10 mg via ORAL
  Filled 2017-07-29 (×2): qty 1

## 2017-07-29 MED ORDER — POTASSIUM CHLORIDE CRYS ER 20 MEQ PO TBCR
40.0000 meq | EXTENDED_RELEASE_TABLET | ORAL | Status: DC | PRN
  Administered 2017-07-29: 40 meq via ORAL
  Filled 2017-07-29: qty 2

## 2017-07-29 NOTE — Progress Notes (Signed)
[facility] [place] NAME: Kaitlyn Bauer    MR#:  [id number]  DATE OF BIRTH:  20-Dec-1921  SUBJECTIVE: Blood pressure is still high.Tobey Bride complaints, no fever.  CHIEF COMPLAINT:   REVIEW OF SYSTEMS:  CONSTITUTIONAL: No fever, fatigue or weakness.  EYES: No blurred or double vision.  EARS, NOSE, AND THROAT: No tinnitus or ear pain.  RESPIRATORY: No cough, shortness of breath, wheezing or hemoptysis.  CARDIOVASCULAR: No chest pain, orthopnea, edema.  GASTROINTESTINAL: No nausea, vomiting, diarrhea or abdominal pain.  GENITOURINARY: No dysuria, hematuria.  ENDOCRINE: No polyuria, nocturia,  HEMATOLOGY: No anemia, easy bruising or bleeding SKIN: No rash or lesion. MUSCULOSKELETAL: No joint pain or arthritis.   NEUROLOGIC: No tingling, numbness, weakness.  PSYCHIATRY: No anxiety or depression.   DRUG ALLERGIES:   Allergies  Allergen Reactions  . Penicillins Hives    VITALS:  Blood pressure (!) 178/80, pulse 90, temperature 98.4 F (36.9 C), temperature source Oral, resp. rate 18, height 5\' 4"  (1.626 m), weight 63.5 kg (140 lb), SpO2 91 %.  PHYSICAL EXAMINATION:  GENERAL:  84 y.o.-year-old patient lying in the bed with no acute distress.  EYES: Pupils equal, round, reactive to light . No scleral icterus. Extraocular muscles intact.  HEENT: Head atraumatic, normocephalic. Oropharynx and nasopharynx clear.  Nasal plate with sutures.  Multiple bruises noticed on the face NECK:  Supple, no jugular venous distention. No thyroid enlargement, no tenderness.  LUNGS: Normal breath sounds bilaterally, no wheezing, rales,rhonchi or crepitation. No use of accessory muscles of respiration.  CARDIOVASCULAR: S1, S2 normal. No murmurs, rubs, or gallops.  ABDOMEN: Soft, nontender, nondistended. Bowel sounds present. No organomegaly or mass.  EXTREMITIES: No pedal edema, cyanosis, or clubbing.  NEUROLOGIC: Cranial nerves II through XII are  intact. Muscle strength 5/5 in all extremities. Sensation intact. Gait not checked.  PSYCHIATRIC: The patient is alert and oriented x 3.  SKIN: No obvious rash, lesion, or ulcer.    LABORATORY PANEL:   CBC Recent Labs  Lab 07/28/17 0431  WBC 11.6*  HGB 11.8*  HCT 34.8*  PLT 169   ------------------------------------------------------------------------------------------------------------------  Chemistries  Recent Labs  Lab 07/29/17 0347  NA 139  K 3.3*  CL 105  CO2 28  GLUCOSE 116*  BUN 13  CREATININE 0.91  CALCIUM 8.0*  MG 2.0   ------------------------------------------------------------------------------------------------------------------  Cardiac Enzymes Recent Labs  Lab 07/27/17 1534  TROPONINI <0.03   ------------------------------------------------------------------------------------------------------------------  RADIOLOGY:  Ct Head Wo Contrast  Result Date: 07/27/2017 CLINICAL DATA:  Fall.  Head injury EXAM: CT HEAD WITHOUT CONTRAST CT MAXILLOFACIAL WITHOUT CONTRAST CT CERVICAL SPINE WITHOUT CONTRAST TECHNIQUE: Multidetector CT imaging of the head, cervical spine, and maxillofacial structures were performed using the standard protocol without intravenous contrast. Multiplanar CT image reconstructions of the cervical spine and maxillofacial structures were also generated. COMPARISON:  None. FINDINGS: CT HEAD FINDINGS Brain: Generalized atrophy with mild chronic microvascular ischemia. Ventricle size normal. Benign appearing calcification right parietal white matter possibly related to prior infarct or infection. Negative for acute infarct, hemorrhage, or mass Vascular: Negative for hyperdense vessel Skull: Negative for skull fracture. Extensive scalp hematoma anteriorly. Other: None CT MAXILLOFACIAL FINDINGS Osseous: Mildly displaced nasal bone fracture. Nasal septum intact. No other facial fracture identified. Degenerative changes in the TMJ bilaterally. Orbits:  Bilateral lens replacement.  Negative for mass lesion. Sinuses: Mild mucosal edema in the maxillary sinus bilaterally without air-fluid level Soft tissues: Extensive scalp hematoma in the frontal region bilaterally.  Nasal laceration. CT CERVICAL SPINE FINDINGS Alignment: 3 mm anterolisthesis C6-7.  Mild anterolisthesis C4-5 Skull base and vertebrae: No fracture identified. Soft tissues and spinal canal: Negative Disc levels: Moderate facet degeneration bilaterally C3 through C7. Mild disc degeneration without spinal stenosis. Upper chest: Negative Other: None IMPRESSION: 1. No acute intracranial abnormality. Atrophy and chronic microvascular ischemia. Relatively large frontal scalp hematoma 2. Mildly displaced nasal bone fracture.  No other facial fracture. 3. Cervical spondylosis without fracture Electronically Signed   By: Franchot Gallo M.D.   On: 07/27/2017 11:01   Ct Cervical Spine Wo Contrast  Result Date: 07/27/2017 CLINICAL DATA:  Fall.  Head injury EXAM: CT HEAD WITHOUT CONTRAST CT MAXILLOFACIAL WITHOUT CONTRAST CT CERVICAL SPINE WITHOUT CONTRAST TECHNIQUE: Multidetector CT imaging of the head, cervical spine, and maxillofacial structures were performed using the standard protocol without intravenous contrast. Multiplanar CT image reconstructions of the cervical spine and maxillofacial structures were also generated. COMPARISON:  None. FINDINGS: CT HEAD FINDINGS Brain: Generalized atrophy with mild chronic microvascular ischemia. Ventricle size normal. Benign appearing calcification right parietal white matter possibly related to prior infarct or infection. Negative for acute infarct, hemorrhage, or mass Vascular: Negative for hyperdense vessel Skull: Negative for skull fracture. Extensive scalp hematoma anteriorly. Other: None CT MAXILLOFACIAL FINDINGS Osseous: Mildly displaced nasal bone fracture. Nasal septum intact. No other facial fracture identified. Degenerative changes in the TMJ bilaterally.  Orbits: Bilateral lens replacement.  Negative for mass lesion. Sinuses: Mild mucosal edema in the maxillary sinus bilaterally without air-fluid level Soft tissues: Extensive scalp hematoma in the frontal region bilaterally. Nasal laceration. CT CERVICAL SPINE FINDINGS Alignment: 3 mm anterolisthesis C6-7.  Mild anterolisthesis C4-5 Skull base and vertebrae: No fracture identified. Soft tissues and spinal canal: Negative Disc levels: Moderate facet degeneration bilaterally C3 through C7. Mild disc degeneration without spinal stenosis. Upper chest: Negative Other: None IMPRESSION: 1. No acute intracranial abnormality. Atrophy and chronic microvascular ischemia. Relatively large frontal scalp hematoma 2. Mildly displaced nasal bone fracture.  No other facial fracture. 3. Cervical spondylosis without fracture Electronically Signed   By: Franchot Gallo M.D.   On: 07/27/2017 11:01   US Carotid Bilateral  Result Date: 07/28/2017 CLINICAL DATA:  Syncope EXAM: BILATERAL CAROTID DUPLEX ULTRASOUND TECHNIQUE: Pearline Cables scale imaging, color Doppler and duplex ultrasound were performed of bilateral carotid and vertebral arteries in the neck. COMPARISON:  None. FINDINGS: Criteria: Quantification of carotid stenosis is based on velocity parameters that correlate the residual internal carotid diameter with NASCET-based stenosis levels, using the diameter of the distal internal carotid lumen as the denominator for stenosis measurement. The following velocity measurements were obtained: RIGHT ICA:  71 cm/sec CCA:  95 cm/sec SYSTOLIC ICA/CCA RATIO:  0.8 DIASTOLIC ICA/CCA RATIO:  0.8 ECA:  90 cm/sec LEFT ICA:  82 cm/sec CCA:  95 cm/sec SYSTOLIC ICA/CCA RATIO:  0.9 DIASTOLIC ICA/CCA RATIO:  1.1 ECA:  103 cm/sec RIGHT CAROTID ARTERY: Little if any plaque in the bulb. Low resistance internal carotid Doppler pattern. RIGHT VERTEBRAL ARTERY:  Antegrade. LEFT CAROTID ARTERY: Little if any plaque in the bulb. Low resistance internal carotid  Doppler pattern. LEFT VERTEBRAL ARTERY:  Antegrade. IMPRESSION: Less than 50% stenosis in the right and left internal carotid arteries. Electronically Signed   By: Marybelle Killings M.D.   On: 07/28/2017 08:21   Ct Maxillofacial Wo Contrast  Result Date: 07/27/2017 CLINICAL DATA:  Fall.  Head injury EXAM: CT HEAD WITHOUT CONTRAST CT MAXILLOFACIAL WITHOUT CONTRAST CT CERVICAL SPINE WITHOUT CONTRAST TECHNIQUE: Multidetector CT imaging  of the head, cervical spine, and maxillofacial structures were performed using the standard protocol without intravenous contrast. Multiplanar CT image reconstructions of the cervical spine and maxillofacial structures were also generated. COMPARISON:  None. FINDINGS: CT HEAD FINDINGS Brain: Generalized atrophy with mild chronic microvascular ischemia. Ventricle size normal. Benign appearing calcification right parietal white matter possibly related to prior infarct or infection. Negative for acute infarct, hemorrhage, or mass Vascular: Negative for hyperdense vessel Skull: Negative for skull fracture. Extensive scalp hematoma anteriorly. Other: None CT MAXILLOFACIAL FINDINGS Osseous: Mildly displaced nasal bone fracture. Nasal septum intact. No other facial fracture identified. Degenerative changes in the TMJ bilaterally. Orbits: Bilateral lens replacement.  Negative for mass lesion. Sinuses: Mild mucosal edema in the maxillary sinus bilaterally without air-fluid level Soft tissues: Extensive scalp hematoma in the frontal region bilaterally. Nasal laceration. CT CERVICAL SPINE FINDINGS Alignment: 3 mm anterolisthesis C6-7.  Mild anterolisthesis C4-5 Skull base and vertebrae: No fracture identified. Soft tissues and spinal canal: Negative Disc levels: Moderate facet degeneration bilaterally C3 through C7. Mild disc degeneration without spinal stenosis. Upper chest: Negative Other: None IMPRESSION: 1. No acute intracranial abnormality. Atrophy and chronic microvascular ischemia. Relatively  large frontal scalp hematoma 2. Mildly displaced nasal bone fracture.  No other facial fracture. 3. Cervical spondylosis without fracture Electronically Signed   By: Franchot Gallo M.D.   On: 07/27/2017 11:01    EKG:   Orders placed or performed during the hospital encounter of 07/27/17  . ED EKG  . ED EKG  . EKG 12-Lead  . EKG 12-Lead    ASSESSMENT AND PLAN:   Kaitlyn Bauer  is a 84 y.o. female with a known history of hypertension not on any medications brought to the hospital secondary to a fall at home.  1. Near syncope-secondary to orthostatic hypotension -Provided gentle hydration -carotid Dopplers with less than 50% stenosis. CT scan with frontal scalp hematoma   2. Fall-mechanical fall. Sutures on nasal bridge  -Relatively large frontal scalp hematoma - no vision changes. Hold aspirin.  3. Leukocytosis- chronic based on PCP notes Also from the fall White blood cell count is trending down 15.1-11.6  4.  Hypokalemia replete potassium Still hypokalemic, continue to replace  5.. DVT prophylaxis- on lovenox  #6. essential hypertension, BP still high, increase the Norvasc to 10 mg daily, likely discharge home tomorrow.  Blood pressure is still high,  still hypokalemic so not able to discharge home     All the records are reviewed and case discussed with Care Management/Social Workerr. Management plans discussed with the patient, family and they are in agreement.  CODE STATUS: fc   TOTAL TIME TAKING CARE OF THIS PATIENT: 35 minutes.   POSSIBLE D/C IN 1 DAYS, DEPENDING ON CLINICAL CONDITION.  Note: This dictation was prepared with Dragon dictation along with smaller phrase technology. Any transcriptional errors that result from this process are unintentional.   Epifanio Lesches M.D on 07/29/2017 at 7:53 AM  Between 7am to 6pm - Pager - [phone] After 6pm go to [email or web address] - password [email or web address] [facility]  [facility]  Office   [phone]  CC: Primary care physician; Kirk Ruths, MD

## 2017-07-29 NOTE — Progress Notes (Signed)
Report given to Olivia Mackie, [place]. She will resume care. Pt comfortably sleeping on bed, not in distress.

## 2017-07-29 NOTE — Progress Notes (Signed)
Physical Therapy Treatment Patient Details Name: Kaitlyn Bauer MRN: [id number] DOB: [birth year] Today's Date: 07/29/2017    History of Present Illness Kaitlyn Bauer  is a 84 y.o. female with a known history of hypertension not on any medications brought to the hospital secondary to a fall at home. Patient in spite of for his age as being very independent, living by herself. Ambulates fine, was driving the car up until a month ago. Today she was getting out off her porch steps and tripped and had a mechanical fall on her face. No recent fevers or chills. Hasn't been sick recently. No cough or congestion. She was diagnosed with hypertension but has not been on any medications for a while. She was put on one medicine in the past by her PCP but stopped taking it as it was making her feel weak. During her last PCP visit about a couple of years ago her blood pressure systolic was in 244W. Patient denies any headaches or dizziness. Here her systolic blood pressure was in the 200s and she received to oral dose of only 50 mg of labetalol and she was being discharged from ER- as soon as she stood up her BP dropped to 10'U systolic and she almost had a syncopal episode. Now she is alert and back to baseline. Her BP is now back to 725'D systolic. She has a large frontal hematoma and also nasal bridge tear which is sutured.    PT Comments    Pt reports feeling well today.  Orthostatic BP's taken - see flow sheets.  Remained stable with little variation today.  Pt was able to stand and ambulate around unit x 1 without difficulty.    Follow Up Recommendations  Home health PT;Other (comment)     Equipment Recommendations       Recommendations for Other Services       Precautions / Restrictions Precautions Precautions: Fall Restrictions Weight Bearing Restrictions: No    Mobility  Bed Mobility Overal bed mobility: Independent             General bed mobility comments: Good speed/sequencing. No  deficits. HOB flat, no bed rails  Transfers Overall transfer level: Needs assistance Equipment used: Rolling walker (2 wheeled) Transfers: Sit to/from Stand Sit to Stand: Supervision         General transfer comment: Safe hand placement, good speed, sequencing, and stability. No deficits  Ambulation/Gait Ambulation/Gait assistance: Min guard Ambulation Distance (Feet): 180 Feet Assistive device: Rolling walker (2 wheeled) Gait Pattern/deviations: WFL(Within Functional Limits);Step-through pattern   Gait velocity interpretation: Below normal speed for age/gender General Gait Details: Generally steady but some safety deficits but overall does well.   Stairs            Wheelchair Mobility    Modified Rankin (Stroke Patients Only)       Balance Overall balance assessment: Needs assistance Sitting-balance support: No upper extremity supported Sitting balance-Leahy Scale: Good     Standing balance support: No upper extremity supported Standing balance-Leahy Scale: Fair Standing balance comment: no LOB and generally steady                            Cognition Arousal/Alertness: Awake/alert Behavior During Therapy: WFL for tasks assessed/performed Overall Cognitive Status: Within Functional Limits for tasks assessed  Exercises      General Comments        Pertinent Vitals/Pain Pain Assessment: No/denies pain    Home Living                      Prior Function            PT Goals (current goals can now be found in the care plan section) Progress towards PT goals: Progressing toward goals    Frequency           PT Plan      Co-evaluation              AM-PAC PT "6 Clicks" Daily Activity  Outcome Measure  Difficulty turning over in bed (including adjusting bedclothes, sheets and blankets)?: None Difficulty moving from lying on back to sitting on the side of the  bed? : None Difficulty sitting down on and standing up from a chair with arms (e.g., wheelchair, bedside commode, etc,.)?: None Help needed moving to and from a bed to chair (including a wheelchair)?: None Help needed walking in hospital room?: A Little Help needed climbing 3-5 steps with a railing? : A Little 6 Click Score: 22    End of Session Equipment Utilized During Treatment: Gait belt Activity Tolerance: Patient tolerated treatment well Patient left: in chair;with call bell/phone within reach;with chair alarm set         Time: 1884-1660 PT Time Calculation (min) (ACUTE ONLY): 19 min  Charges:  $Gait Training: 8-22 mins                    G Codes:       Chesley Noon, PTA 07/29/17, 12:34 PM

## 2017-07-30 MED ORDER — AMLODIPINE BESYLATE 10 MG PO TABS: 10.0000 mg | ORAL_TABLET | Freq: Every day | ORAL | 0 refills | Status: AC

## 2017-07-30 MED ORDER — DOXYCYCLINE HYCLATE 100 MG PO CAPS: 100.0000 mg | ORAL_CAPSULE | Freq: Two times a day (BID) | ORAL | 0 refills | Status: AC

## 2017-07-30 MED ORDER — HYDRALAZINE HCL 25 MG PO TABS: 25.0000 mg | ORAL_TABLET | Freq: Three times a day (TID) | ORAL | 0 refills | Status: AC

## 2017-07-30 NOTE — Progress Notes (Signed)
Patient is being discharged to home today with [facility] instructions given and patient acknowledged understanding. She had no questions. IV removed, belongings packed.

## 2017-07-30 NOTE — Progress Notes (Signed)
Family is here to take patient home.  NT is helping her dress for transportation.

## 2017-07-30 NOTE — Progress Notes (Signed)
Discharged home in stable condition, spoke with patient's daughter over the phone and she can pick up around 3 PM.  Instructions at the computer.  Arranged home health.

## 2017-07-30 NOTE — Care Management Note (Signed)
Case Management Note  Patient Details  Name: Kaitlyn Bauer MRN: [id number] Date of Birth: 08/19/1921  Subjective/Objective:   Discussed discharge planning with [name]. [facility] declined this home health services referral per insurance reasons. A referral for HH=PT, RN, Aide, SW was called to [facility] and was accepted. [name] has a RW, [place], and BSC at home, and is being discharged home today.                  Action/Plan:   Expected Discharge Date:  07/30/17               Expected Discharge Plan:  [place]  In-House Referral:     Discharge planning Services  CM Consult  Post Acute Care Choice:  Home Health Choice offered to:  Patient  DME Arranged:    DME Agency:     HH Arranged:  RN, PT, Nurse's Aide, Social Work [employer] Agency:  [employer]  Status of Service:  Completed, signed off  If discussed at [employer] [facility] [employer], dates discussed:    Additional Comments:  Michaelanthony Kempton A, RN 07/30/2017, 12:08 PM

## 2017-08-02 NOTE — Discharge Summary (Signed)
ERIE SICA, is a 84 y.o. female  DOB June 27, 1922  MRN [id number].  Admission date:  07/27/2017  Admitting Physician  Gladstone Lighter, MD  Discharge Date:  08/02/2017   Primary MD  Kirk Ruths, MD  Recommendations for primary care physician for things to follow:  Follow-up with PCP in 1 week.   Admission Diagnosis  Syncope [R55] Near syncope [R55] Closed fracture of nasal bone, initial encounter [S02.2XXA] Hypertension, unspecified type [I10]   Discharge Diagnosis  Syncope [R55] Near syncope [R55] Closed fracture of nasal bone, initial encounter [S02.2XXA] Hypertension, unspecified type [I10]    Active Problems:   Fall      Past Medical History:  Diagnosis Date  . Hypertension     Past Surgical History:  Procedure Laterality Date  . ABDOMINAL HYSTERECTOMY    . APPENDECTOMY    . CHOLECYSTECTOMY         History of present illness and  Hospital Course:     Kindly see H&P for history of present illness and admission details, please review complete Labs, Consult reports and Test reports for all details in brief  HPI  from the history and physical done on the day of admission 84 year old female with history of hypertension not on any medicines came in because of syncope.  Admitted for the same.   Hospital Course  #1 near syncope secondary to orthostatic hypotension, received gentle hydration, ultrasound of carotids showed less than 50% stenosis, CT head showed frontal scalp hematoma.  Therapy recommended home health physical therapy.  Blood pressure dropped to 90 systolic by standing.  By lying down blood pressure was systolic in 008Q.  2.  Mechanical fall with nasal bridge fracture, sutured in the emergency room, patient had a relatively large frontal scalp hematoma, no vision changes, we held  aspirin while in the hospital,  3.  Hypokalemia: Replace the potassium, 4.  Leukocytosis: Chronic.  WBC decreased from 15-11. 4.  Essential hypertension: Uncontrolled: Increase the Norvasc to 10 mg daily, added hydralazine also.  Discussed this with patient's daughter over the phone.     Discharge Condition: Stable  Follow UP  Follow-up Information    Kirk Ruths, MD. Schedule an appointment as soon as possible for a visit on 07/31/2017.   Specialty:  Internal Medicine Why:  For ER visit follow-up and reevaluation. Contact information: [place] [address] [phone]        Carloyn Manner, MD. Schedule an appointment as soon as possible for a visit on 08/01/2017.   Specialty:  Otolaryngology Why:  For ER visit follow-up and reevaluation. at [place] information: [place] [id number] [phone]        Baxter Hire, MD On 07/31/2017.   Specialty:  Internal Medicine Why:  AT 2P Contact information: [place] [address] [phone]             Discharge Instructions  and  Discharge Medications      Allergies as of 07/30/2017      Reactions   Penicillins Hives      Medication List    TAKE these medications   amLODipine 10 MG tablet Commonly known as:  NORVASC Take 1 tablet (10 mg total) by mouth daily.   aspirin 325 MG EC tablet Take 325 mg by mouth as needed (arthritis).   doxycycline 100 MG capsule Commonly known as:  VIBRAMYCIN Take 1 capsule (  100 mg total) by mouth 2 (two) times daily.   hydrALAZINE 25 MG tablet Commonly known as:  APRESOLINE Take 1 tablet (25 mg total) by mouth 3 (three) times daily.   MULTI-VITAMINS Tabs Take 1 tablet by mouth daily.   VITAMIN D-1000 MAX ST 1000 units tablet Generic drug:  Cholecalciferol Take 1,000 Units by mouth daily.         Diet and Activity recommendation: See  Discharge Instructions above   Consults obtained -physical therapy   Major procedures and Radiology Reports - PLEASE review detailed and final reports for all details, in brief -     Ct Head Wo Contrast  Result Date: 07/27/2017 CLINICAL DATA:  Fall.  Head injury EXAM: CT HEAD WITHOUT CONTRAST CT MAXILLOFACIAL WITHOUT CONTRAST CT CERVICAL SPINE WITHOUT CONTRAST TECHNIQUE: Multidetector CT imaging of the head, cervical spine, and maxillofacial structures were performed using the standard protocol without intravenous contrast. Multiplanar CT image reconstructions of the cervical spine and maxillofacial structures were also generated. COMPARISON:  None. FINDINGS: CT HEAD FINDINGS Brain: Generalized atrophy with mild chronic microvascular ischemia. Ventricle size normal. Benign appearing calcification right parietal white matter possibly related to prior infarct or infection. Negative for acute infarct, hemorrhage, or mass Vascular: Negative for hyperdense vessel Skull: Negative for skull fracture. Extensive scalp hematoma anteriorly. Other: None CT MAXILLOFACIAL FINDINGS Osseous: Mildly displaced nasal bone fracture. Nasal septum intact. No other facial fracture identified. Degenerative changes in the TMJ bilaterally. Orbits: Bilateral lens replacement.  Negative for mass lesion. Sinuses: Mild mucosal edema in the maxillary sinus bilaterally without air-fluid level Soft tissues: Extensive scalp hematoma in the frontal region bilaterally. Nasal laceration. CT CERVICAL SPINE FINDINGS Alignment: 3 mm anterolisthesis C6-7.  Mild anterolisthesis C4-5 Skull base and vertebrae: No fracture identified. Soft tissues and spinal canal: Negative Disc levels: Moderate facet degeneration bilaterally C3 through C7. Mild disc degeneration without spinal stenosis. Upper chest: Negative Other: None IMPRESSION: 1. No acute intracranial abnormality. Atrophy and chronic microvascular ischemia. Relatively large frontal scalp  hematoma 2. Mildly displaced nasal bone fracture.  No other facial fracture. 3. Cervical spondylosis without fracture Electronically Signed   By: Franchot Gallo M.D.   On: 07/27/2017 11:01   Ct Cervical Spine Wo Contrast  Result Date: 07/27/2017 CLINICAL DATA:  Fall.  Head injury EXAM: CT HEAD WITHOUT CONTRAST CT MAXILLOFACIAL WITHOUT CONTRAST CT CERVICAL SPINE WITHOUT CONTRAST TECHNIQUE: Multidetector CT imaging of the head, cervical spine, and maxillofacial structures were performed using the standard protocol without intravenous contrast. Multiplanar CT image reconstructions of the cervical spine and maxillofacial structures were also generated. COMPARISON:  None. FINDINGS: CT HEAD FINDINGS Brain: Generalized atrophy with mild chronic microvascular ischemia. Ventricle size normal. Benign appearing calcification right parietal white matter possibly related to prior infarct or infection. Negative for acute infarct, hemorrhage, or mass Vascular: Negative for hyperdense vessel Skull: Negative for skull fracture. Extensive scalp hematoma anteriorly. Other: None CT MAXILLOFACIAL FINDINGS Osseous: Mildly displaced nasal bone fracture. Nasal septum intact. No other facial fracture identified. Degenerative changes in the TMJ bilaterally. Orbits: Bilateral lens replacement.  Negative for mass lesion. Sinuses: Mild mucosal edema in the maxillary sinus bilaterally without air-fluid level Soft tissues: Extensive scalp hematoma in the frontal region bilaterally. Nasal laceration. CT CERVICAL SPINE FINDINGS Alignment: 3 mm anterolisthesis C6-7.  Mild anterolisthesis C4-5 Skull base and vertebrae: No fracture identified. Soft tissues and spinal canal: Negative Disc levels: Moderate facet degeneration bilaterally C3 through C7. Mild disc degeneration without spinal stenosis. Upper chest: Negative Other: None  IMPRESSION: 1. No acute intracranial abnormality. Atrophy and chronic microvascular ischemia. Relatively large frontal  scalp hematoma 2. Mildly displaced nasal bone fracture.  No other facial fracture. 3. Cervical spondylosis without fracture Electronically Signed   By: Franchot Gallo M.D.   On: 07/27/2017 11:01   US Carotid Bilateral  Result Date: 07/28/2017 CLINICAL DATA:  Syncope EXAM: BILATERAL CAROTID DUPLEX ULTRASOUND TECHNIQUE: Pearline Cables scale imaging, color Doppler and duplex ultrasound were performed of bilateral carotid and vertebral arteries in the neck. COMPARISON:  None. FINDINGS: Criteria: Quantification of carotid stenosis is based on velocity parameters that correlate the residual internal carotid diameter with NASCET-based stenosis levels, using the diameter of the distal internal carotid lumen as the denominator for stenosis measurement. The following velocity measurements were obtained: RIGHT ICA:  71 cm/sec CCA:  95 cm/sec SYSTOLIC ICA/CCA RATIO:  0.8 DIASTOLIC ICA/CCA RATIO:  0.8 ECA:  90 cm/sec LEFT ICA:  82 cm/sec CCA:  95 cm/sec SYSTOLIC ICA/CCA RATIO:  0.9 DIASTOLIC ICA/CCA RATIO:  1.1 ECA:  103 cm/sec RIGHT CAROTID ARTERY: Little if any plaque in the bulb. Low resistance internal carotid Doppler pattern. RIGHT VERTEBRAL ARTERY:  Antegrade. LEFT CAROTID ARTERY: Little if any plaque in the bulb. Low resistance internal carotid Doppler pattern. LEFT VERTEBRAL ARTERY:  Antegrade. IMPRESSION: Less than 50% stenosis in the right and left internal carotid arteries. Electronically Signed   By: Marybelle Killings M.D.   On: 07/28/2017 08:21   Ct Maxillofacial Wo Contrast  Result Date: 07/27/2017 CLINICAL DATA:  Fall.  Head injury EXAM: CT HEAD WITHOUT CONTRAST CT MAXILLOFACIAL WITHOUT CONTRAST CT CERVICAL SPINE WITHOUT CONTRAST TECHNIQUE: Multidetector CT imaging of the head, cervical spine, and maxillofacial structures were performed using the standard protocol without intravenous contrast. Multiplanar CT image reconstructions of the cervical spine and maxillofacial structures were also generated. COMPARISON:  None.  FINDINGS: CT HEAD FINDINGS Brain: Generalized atrophy with mild chronic microvascular ischemia. Ventricle size normal. Benign appearing calcification right parietal white matter possibly related to prior infarct or infection. Negative for acute infarct, hemorrhage, or mass Vascular: Negative for hyperdense vessel Skull: Negative for skull fracture. Extensive scalp hematoma anteriorly. Other: None CT MAXILLOFACIAL FINDINGS Osseous: Mildly displaced nasal bone fracture. Nasal septum intact. No other facial fracture identified. Degenerative changes in the TMJ bilaterally. Orbits: Bilateral lens replacement.  Negative for mass lesion. Sinuses: Mild mucosal edema in the maxillary sinus bilaterally without air-fluid level Soft tissues: Extensive scalp hematoma in the frontal region bilaterally. Nasal laceration. CT CERVICAL SPINE FINDINGS Alignment: 3 mm anterolisthesis C6-7.  Mild anterolisthesis C4-5 Skull base and vertebrae: No fracture identified. Soft tissues and spinal canal: Negative Disc levels: Moderate facet degeneration bilaterally C3 through C7. Mild disc degeneration without spinal stenosis. Upper chest: Negative Other: None IMPRESSION: 1. No acute intracranial abnormality. Atrophy and chronic microvascular ischemia. Relatively large frontal scalp hematoma 2. Mildly displaced nasal bone fracture.  No other facial fracture. 3. Cervical spondylosis without fracture Electronically Signed   By: Franchot Gallo M.D.   On: 07/27/2017 11:01    Micro Results     No results found for this or any previous visit (from the past 240 hour(s)).     Today   Subjective:   Ragena Fiola today has no headache,no chest abdominal pain,no new weakness tingling or numbness, feels much better wants to go home today.   Objective:   Blood pressure (!) 153/76, pulse 67, temperature 98.6 F (37 C), temperature source Oral, resp. rate 18, height 5\' 4"  (1.626 m), weight 63.5  kg (140 lb), SpO2 96 %.  No intake or  output data in the 24 hours ending 08/02/17 1411  Exam Awake Alert, Oriented x 3, No new F.N deficits, Normal affect [place].AT,PERRAL has multiple bruises over the face, nasal bone with the sutures. Supple Neck,No JVD, No cervical lymphadenopathy appriciated.  Symmetrical Chest wall movement, Good air movement bilaterally, CTAB RRR,No Gallops,Rubs or new Murmurs, No Parasternal Heave +ve B.Sounds, Abd Soft, Non tender, No organomegaly appriciated, No rebound -guarding or rigidity. No Cyanosis, Clubbing or edema, No new Rash or bruise  Data Review   CBC w Diff:  Lab Results  Component Value Date   WBC 11.6 (H) 07/28/2017   HGB 11.8 (L) 07/28/2017   HCT 34.8 (L) 07/28/2017   PLT 169 07/28/2017    CMP:  Lab Results  Component Value Date   NA 139 07/29/2017   K 3.3 (L) 07/29/2017   CL 105 07/29/2017   CO2 28 07/29/2017   BUN 13 07/29/2017   CREATININE 0.91 07/29/2017  .   Total Time in preparing paper work, data evaluation and todays exam - 35 minutes  Epifanio Lesches M.D on 07/30/2017 at 2:11 PM    Note: This dictation was prepared with Dragon dictation along with smaller phrase technology. Any transcriptional errors that result from this process are unintentional.

## 2018-01-31 ENCOUNTER — Inpatient Hospital Stay (HOSPITAL_COMMUNITY)
Admission: EM | Admit: 2018-01-31 | Discharge: 2018-02-22 | DRG: 065 | Disposition: E | Payer: Medicare HMO | Source: Other Acute Inpatient Hospital | Attending: Internal Medicine | Admitting: Internal Medicine

## 2018-01-31 ENCOUNTER — Encounter (HOSPITAL_COMMUNITY): Payer: Self-pay | Admitting: Emergency Medicine

## 2018-01-31 ENCOUNTER — Inpatient Hospital Stay (HOSPITAL_COMMUNITY): Payer: Medicare HMO

## 2018-01-31 ENCOUNTER — Emergency Department: Payer: Medicare HMO

## 2018-01-31 ENCOUNTER — Other Ambulatory Visit: Payer: Self-pay

## 2018-01-31 ENCOUNTER — Emergency Department
Admission: EM | Admit: 2018-01-31 | Discharge: 2018-01-31 | Disposition: A | Discharge status: Other Acute Inpatient Hospital | Payer: Medicare HMO | Attending: Emergency Medicine | Admitting: Emergency Medicine

## 2018-01-31 DIAGNOSIS — Z66 Do not resuscitate: Secondary | ICD-10-CM | POA: Diagnosis not present

## 2018-01-31 DIAGNOSIS — I63512 Cerebral infarction due to unspecified occlusion or stenosis of left middle cerebral artery: Secondary | ICD-10-CM | POA: Diagnosis not present

## 2018-01-31 DIAGNOSIS — I1310 Hypertensive heart and chronic kidney disease without heart failure, with stage 1 through stage 4 chronic kidney disease, or unspecified chronic kidney disease: Secondary | ICD-10-CM | POA: Diagnosis present

## 2018-01-31 DIAGNOSIS — I351 Nonrheumatic aortic (valve) insufficiency: Secondary | ICD-10-CM | POA: Diagnosis not present

## 2018-01-31 DIAGNOSIS — C50912 Malignant neoplasm of unspecified site of left female breast: Secondary | ICD-10-CM | POA: Diagnosis present

## 2018-01-31 DIAGNOSIS — I639 Cerebral infarction, unspecified: Secondary | ICD-10-CM | POA: Insufficient documentation

## 2018-01-31 DIAGNOSIS — I6300 Cerebral infarction due to thrombosis of unspecified precerebral artery: Secondary | ICD-10-CM | POA: Diagnosis not present

## 2018-01-31 DIAGNOSIS — Z7982 Long term (current) use of aspirin: Secondary | ICD-10-CM | POA: Diagnosis not present

## 2018-01-31 DIAGNOSIS — Z9049 Acquired absence of other specified parts of digestive tract: Secondary | ICD-10-CM | POA: Diagnosis not present

## 2018-01-31 DIAGNOSIS — R29810 Facial weakness: Secondary | ICD-10-CM | POA: Diagnosis present

## 2018-01-31 DIAGNOSIS — R29707 NIHSS score 7: Secondary | ICD-10-CM | POA: Diagnosis not present

## 2018-01-31 DIAGNOSIS — Z87891 Personal history of nicotine dependence: Secondary | ICD-10-CM | POA: Diagnosis not present

## 2018-01-31 DIAGNOSIS — N6320 Unspecified lump in the left breast, unspecified quadrant: Secondary | ICD-10-CM | POA: Diagnosis not present

## 2018-01-31 DIAGNOSIS — C9110 Chronic lymphocytic leukemia of B-cell type not having achieved remission: Secondary | ICD-10-CM | POA: Diagnosis not present

## 2018-01-31 DIAGNOSIS — I10 Essential (primary) hypertension: Secondary | ICD-10-CM | POA: Insufficient documentation

## 2018-01-31 DIAGNOSIS — I4891 Unspecified atrial fibrillation: Secondary | ICD-10-CM

## 2018-01-31 DIAGNOSIS — N183 Chronic kidney disease, stage 3 unspecified: Secondary | ICD-10-CM | POA: Diagnosis present

## 2018-01-31 DIAGNOSIS — D72829 Elevated white blood cell count, unspecified: Secondary | ICD-10-CM

## 2018-01-31 DIAGNOSIS — N63 Unspecified lump in unspecified breast: Secondary | ICD-10-CM | POA: Diagnosis not present

## 2018-01-31 DIAGNOSIS — R4701 Aphasia: Secondary | ICD-10-CM | POA: Diagnosis not present

## 2018-01-31 DIAGNOSIS — R29704 NIHSS score 4: Secondary | ICD-10-CM | POA: Diagnosis present

## 2018-01-31 DIAGNOSIS — R739 Hyperglycemia, unspecified: Secondary | ICD-10-CM | POA: Diagnosis not present

## 2018-01-31 DIAGNOSIS — N2889 Other specified disorders of kidney and ureter: Secondary | ICD-10-CM | POA: Diagnosis present

## 2018-01-31 DIAGNOSIS — K560 Paralytic ileus: Secondary | ICD-10-CM | POA: Diagnosis not present

## 2018-01-31 DIAGNOSIS — Z515 Encounter for palliative care: Secondary | ICD-10-CM | POA: Diagnosis not present

## 2018-01-31 DIAGNOSIS — Z79899 Other long term (current) drug therapy: Secondary | ICD-10-CM | POA: Insufficient documentation

## 2018-01-31 DIAGNOSIS — Z880 Allergy status to penicillin: Secondary | ICD-10-CM

## 2018-01-31 DIAGNOSIS — N390 Urinary tract infection, site not specified: Secondary | ICD-10-CM | POA: Diagnosis not present

## 2018-01-31 DIAGNOSIS — R5381 Other malaise: Secondary | ICD-10-CM | POA: Diagnosis not present

## 2018-01-31 DIAGNOSIS — E041 Nontoxic single thyroid nodule: Secondary | ICD-10-CM | POA: Diagnosis not present

## 2018-01-31 DIAGNOSIS — K567 Ileus, unspecified: Secondary | ICD-10-CM

## 2018-01-31 DIAGNOSIS — I63412 Cerebral infarction due to embolism of left middle cerebral artery: Principal | ICD-10-CM | POA: Diagnosis present

## 2018-01-31 DIAGNOSIS — R109 Unspecified abdominal pain: Secondary | ICD-10-CM

## 2018-01-31 DIAGNOSIS — N261 Atrophy of kidney (terminal): Secondary | ICD-10-CM | POA: Diagnosis present

## 2018-01-31 DIAGNOSIS — E785 Hyperlipidemia, unspecified: Secondary | ICD-10-CM | POA: Diagnosis present

## 2018-01-31 DIAGNOSIS — K56609 Unspecified intestinal obstruction, unspecified as to partial versus complete obstruction: Secondary | ICD-10-CM

## 2018-01-31 DIAGNOSIS — R1084 Generalized abdominal pain: Secondary | ICD-10-CM | POA: Diagnosis not present

## 2018-01-31 DIAGNOSIS — Z7901 Long term (current) use of anticoagulants: Secondary | ICD-10-CM | POA: Diagnosis not present

## 2018-01-31 DIAGNOSIS — Z0189 Encounter for other specified special examinations: Secondary | ICD-10-CM

## 2018-01-31 DIAGNOSIS — R4182 Altered mental status, unspecified: Secondary | ICD-10-CM | POA: Diagnosis present

## 2018-01-31 LAB — DIFFERENTIAL
Basophils Absolute: 0 10*3/uL (ref 0–0.1)
Basophils Relative: 0 %
EOS ABS: 0.1 10*3/uL (ref 0–0.7)
Eosinophils Relative: 1 %
Lymphocytes Relative: 53 %
Lymphs Abs: 7 10*3/uL — ABNORMAL HIGH (ref 1.0–3.6)
MONOS PCT: 5 %
Monocytes Absolute: 0.7 10*3/uL (ref 0.2–0.9)
Neutro Abs: 5.4 10*3/uL (ref 1.4–6.5)
Neutrophils Relative %: 41 %

## 2018-01-31 LAB — CBC
HCT: 39.4 % (ref 35.0–47.0)
Hemoglobin: 13.5 g/dL (ref 12.0–16.0)
MCH: 31.8 pg (ref 26.0–34.0)
MCHC: 34.2 g/dL (ref 32.0–36.0)
MCV: 93 fL (ref 80.0–100.0)
PLATELETS: 208 10*3/uL (ref 150–440)
RBC: 4.24 MIL/uL (ref 3.80–5.20)
RDW: 14.9 % — AB (ref 11.5–14.5)
WBC: 13.2 10*3/uL — ABNORMAL HIGH (ref 3.6–11.0)

## 2018-01-31 LAB — COMPREHENSIVE METABOLIC PANEL
ALK PHOS: 78 U/L (ref 38–126)
ALT: 10 U/L (ref 0–44)
AST: 24 U/L (ref 15–41)
Albumin: 3.6 g/dL (ref 3.5–5.0)
Anion gap: 9 (ref 5–15)
BUN: 14 mg/dL (ref 8–23)
CALCIUM: 9 mg/dL (ref 8.9–10.3)
CHLORIDE: 103 mmol/L (ref 98–111)
CO2: 28 mmol/L (ref 22–32)
CREATININE: 0.96 mg/dL (ref 0.44–1.00)
GFR, EST AFRICAN AMERICAN: 57 mL/min — AB (ref >60)
GFR, EST NON AFRICAN AMERICAN: 49 mL/min — AB (ref >60)
Glucose, Bld: 202 mg/dL — ABNORMAL HIGH (ref 70–99)
Potassium: 2.9 mmol/L — ABNORMAL LOW (ref 3.5–5.1)
Sodium: 140 mmol/L (ref 135–145)
Total Bilirubin: 0.6 mg/dL (ref 0.3–1.2)
Total Protein: 6.6 g/dL (ref 6.5–8.1)

## 2018-01-31 LAB — APTT: aPTT: 27 seconds (ref 24–36)

## 2018-01-31 LAB — GLUCOSE, CAPILLARY: Glucose-Capillary: 180 mg/dL — ABNORMAL HIGH (ref 70–99)

## 2018-01-31 LAB — PROTIME-INR
INR: 1.01
PROTHROMBIN TIME: 13.2 s (ref 11.4–15.2)

## 2018-01-31 LAB — TROPONIN I

## 2018-01-31 SURGERY — IR WITH ANESTHESIA
Anesthesia: General

## 2018-01-31 MED ORDER — HEPARIN SODIUM (PORCINE) 5000 UNIT/ML IJ SOLN
5000.0000 [IU] | Freq: Three times a day (TID) | INTRAMUSCULAR | Status: DC
  Administered 2018-02-01: 5000 [IU] via SUBCUTANEOUS
  Filled 2018-01-31: qty 1

## 2018-01-31 MED ORDER — ACETAMINOPHEN 325 MG PO TABS
650.0000 mg | ORAL_TABLET | ORAL | Status: DC | PRN
  Administered 2018-02-09 – 2018-02-10 (×2): 650 mg via ORAL
  Filled 2018-01-31 (×2): qty 2

## 2018-01-31 MED ORDER — SODIUM CHLORIDE 0.9 % IV BOLUS
500.0000 mL | Freq: Once | INTRAVENOUS | Status: AC
  Administered 2018-01-31: 500 mL via INTRAVENOUS

## 2018-01-31 MED ORDER — SODIUM CHLORIDE 0.9 % IV SOLN
INTRAVENOUS | Status: DC
  Administered 2018-02-01 – 2018-02-06 (×5): via INTRAVENOUS
  Administered 2018-02-08: 1000 mL via INTRAVENOUS
  Administered 2018-02-09: 09:00:00 via INTRAVENOUS
  Administered 2018-02-09: 1000 mL via INTRAVENOUS
  Administered 2018-02-10 – 2018-02-11 (×2): via INTRAVENOUS

## 2018-01-31 MED ORDER — ACETAMINOPHEN 650 MG RE SUPP: 650.0000 mg | RECTAL | Status: DC | PRN

## 2018-01-31 MED ORDER — ACETAMINOPHEN 160 MG/5ML PO SOLN: 650.0000 mg | ORAL | Status: DC | PRN

## 2018-01-31 MED ORDER — ASPIRIN 300 MG RE SUPP
300.0000 mg | Freq: Once | RECTAL | Status: AC
  Administered 2018-01-31: 300 mg via RECTAL
  Filled 2018-01-31: qty 1

## 2018-01-31 MED ORDER — SENNOSIDES-DOCUSATE SODIUM 8.6-50 MG PO TABS
1.0000 | ORAL_TABLET | Freq: Every evening | ORAL | Status: DC | PRN
  Administered 2018-02-07: 1 via ORAL
  Filled 2018-01-31: qty 1

## 2018-01-31 MED ORDER — STROKE: EARLY STAGES OF RECOVERY BOOK
Freq: Once | Status: AC
  Administered 2018-02-01: 1
  Filled 2018-01-31 (×2): qty 1

## 2018-01-31 MED ORDER — IOPAMIDOL (ISOVUE-370) INJECTION 76%
100.0000 mL | Freq: Once | INTRAVENOUS | Status: AC | PRN
  Administered 2018-01-31: 100 mL via INTRAVENOUS

## 2018-01-31 NOTE — ED Triage Notes (Addendum)
To ER via [facility] from home c/o approx 1600-1630 with slurred speech, extremity weakness, confusion. symptoms resolved. EMS reports VSS. CBG WNL. Pt able to move herself onto stretcher from bed. Pt has slurred speech at this time. Alert to self, disoriented to situation and time. Cannot recall birthday.

## 2018-01-31 NOTE — ED Notes (Signed)
EMTALA reviewed by this RN. Secretary informed at this time.

## 2018-01-31 NOTE — ED Provider Notes (Signed)
[facility] Emergency Department Provider Note ____________________________________________   First MD Initiated Contact with Patient 02/19/2018 1714     (approximate)  I have reviewed the triage vital signs and the nursing notes.   HISTORY  Chief Complaint Weakness  HPI Kaitlyn Bauer is a 84 y.o. female with a history of atrial fibrillation was presenting to the emergency department today with an altered mental status.  Last seen normal at noon.  Patient was at her primary care doctor's office and was diagnosed with UTI.  Is taken her first dose of Macrobid as of 4 this afternoon.  Patient was noted with slurred speech for about a half an hour.  Resolved upon arrival of EMS at 2 PM.  Blood sugar in the 200s.  No history of stroke.  Patient does not take a novel anticoagulant or Coumadin.  Past Medical History:  Diagnosis Date  . Hypertension     Patient Active Problem List   Diagnosis Date Noted  . Fall 07/27/2017    Past Surgical History:  Procedure Laterality Date  . ABDOMINAL HYSTERECTOMY    . APPENDECTOMY    . CHOLECYSTECTOMY      Prior to Admission medications   Medication Sig Start Date End Date Taking? Authorizing Provider  amLODipine (NORVASC) 10 MG tablet Take 1 tablet (10 mg total) by mouth daily. 07/30/17  Yes Epifanio Lesches, MD  aspirin 325 MG EC tablet Take 325 mg by mouth as needed (arthritis).   Yes [provider]  Cholecalciferol (VITAMIN D-1000 MAX ST) 1000 units tablet Take 1,000 Units by mouth daily.   Yes [provider]  doxycycline (VIBRAMYCIN) 100 MG capsule Take 1 capsule (100 mg total) by mouth 2 (two) times daily. 07/30/17  Yes Epifanio Lesches, MD  hydrALAZINE (APRESOLINE) 25 MG tablet Take 1 tablet (25 mg total) by mouth 3 (three) times daily. 07/30/17 07/30/18 Yes Epifanio Lesches, MD  Multiple Vitamin (MULTI-VITAMINS) TABS Take 1 tablet by mouth daily.   Yes [provider]    nitrofurantoin, macrocrystal-monohydrate, (MACROBID) 100 MG capsule Take 100 mg by mouth 2 (two) times daily. 02/14/2018 02/07/18 Yes [provider]    Allergies Penicillins  Family History  Family history unknown: Yes    Social History Social History   Tobacco Use  . Smoking status: Former Research scientist (life sciences)  . Smokeless tobacco: Never Used  Substance Use Topics  . Alcohol use: No    Frequency: Never  . Drug use: No    Review of Systems  Level 5 caveat secondary to confusion.   ____________________________________________   PHYSICAL EXAM:  VITAL SIGNS: ED Triage Vitals [02/18/2018 1703]  Enc Vitals Group     BP (!) 190/80     Pulse Rate (!) 112     Resp 18     Temp 97.9 F (36.6 C)     Temp Source Oral     SpO2 94 %     Weight 130 lb (59 kg)     Height      Head Circumference      Peak Flow      Pain Score 0     Pain Loc      Pain Edu?      Excl. in [place]?     Constitutional: Alert and oriented to self only.  in no acute distress. Eyes: Conjunctivae are normal.  Head: Atraumatic. Nose: No congestion/rhinnorhea. Mouth/Throat: Mucous membranes are moist.  Neck: No stridor.   Cardiovascular: Mild tachycardia with an  irregularly irregular rhythm.  Grossly normal heart sounds.   Respiratory: Normal respiratory effort.  No retractions. Lungs CTAB. Gastrointestinal: Soft and nontender. No distention.  Musculoskeletal: No lower extremity tenderness nor edema.  No joint effusions. Neurologic: Mildly slurred speech.  Mild right facial droop. Skin:  Skin is warm, dry and intact. No rash noted. Psychiatric: Mood and affect are normal. Speech and behavior are normal.  NIH Stroke Scale  Time: 8:54 PM Person Administering Scale: Doran Stabler  Administer stroke scale items in the order listed. Record performance in each category after each subscale exam. Do not go back and change scores. Follow directions provided for each exam technique. Scores should reflect what  the patient does, not what the clinician thinks the patient can do. The clinician should record answers while administering the exam and work quickly. Except where indicated, the patient should not be coached (i.e., repeated requests to patient to make a special effort).   1a  Level of consciousness: 0=alert; keenly responsive  1b. LOC questions:  1=Performs one task correctly  1c. LOC commands: 1=Performs one task correctly  2.  Best Gaze: 0=normal  3.  Visual: 0=No visual loss  4. Facial Palsy: 1=Minor paralysis (flattened nasolabial fold, asymmetric on smiling)  5a.  Motor left arm: 0=No drift, limb holds 90 (or 45) degrees for full 10 seconds  5b.  Motor right arm: 0=No drift, limb holds 90 (or 45) degrees for full 10 seconds  6a. motor left leg: 0=No drift, limb holds 90 (or 45) degrees for full 10 seconds  6b  Motor right leg:  0=No drift, limb holds 90 (or 45) degrees for full 10 seconds  7. Limb Ataxia: 0=Absent  8.  Sensory: 0=Normal; no sensory loss  9. Best Language:  0=No aphasia, normal  10. Dysarthria: 1=Mild to moderate, patient slurs at least some words and at worst, can be understood with some difficulty  11. Extinction and Inattention: 0=No abnormality  12. Distal motor function: 0=Normal   Total:   4   ____________________________________________   LABS (all labs ordered are listed, but only abnormal results are displayed)  Labs Reviewed  CBC - Abnormal; Notable for the following components:      Result Value   WBC 13.2 (*)    RDW 14.9 (*)    All other components within normal limits  DIFFERENTIAL - Abnormal; Notable for the following components:   Lymphs Abs 7.0 (*)    All other components within normal limits  COMPREHENSIVE METABOLIC PANEL - Abnormal; Notable for the following components:   Potassium 2.9 (*)    Glucose, Bld 202 (*)    GFR calc non Af Amer 49 (*)    GFR calc Af Amer 57 (*)    All other components within normal limits  GLUCOSE, CAPILLARY -  Abnormal; Notable for the following components:   Glucose-Capillary 180 (*)    All other components within normal limits  PROTIME-INR  APTT  TROPONIN I  CBG MONITORING, ED   ____________________________________________  EKG  ED ECG REPORT I, Doran Stabler, the attending physician, personally viewed and interpreted this ECG.   Date: 02/13/2018  EKG Time: 1709  Rate: 100  Rhythm: atrial fibrillation, rate 100  Axis: Normal  Intervals:Incomplete right bundle branch block and left anterior fascicular block  ST&T Change: No ST segment elevation or depression.  No abnormal T wave inversion.  ____________________________________________  RADIOLOGY  CT noncontrast without acute finding.  CT perfusion scan with occlusion in the  left M2 inferior division. ____________________________________________   PROCEDURES  Procedure(s) performed:   Procedures  Critical Care performed:   ____________________________________________   INITIAL IMPRESSION / ASSESSMENT AND PLAN / ED COURSE  Pertinent labs & imaging results that were available during my care of the patient were reviewed by me and considered in my medical decision making (see chart for details).  Differential diagnosis includes, but is not limited to, alcohol, illicit or prescription medications, or other toxic ingestion; intracranial pathology such as stroke or intracerebral hemorrhage; fever or infectious causes including sepsis; hypoxemia and/or hypercarbia; uremia; trauma; endocrine related disorders such as diabetes, hypoglycemia, and thyroid-related diseases; hypertensive encephalopathy; etc. As part of my medical decision making, I reviewed the following data within the electronic MEDICAL RECORD NUMBER Notes from prior ED visits  ----------------------------------------- 8:54 PM on 02/14/2018 -----------------------------------------  I discussed the case with the family as well as the patient concerning the TPA.  I  was asked to come to the bedside to discuss the case with the initial consulting tele-neurologist.  Due to risks of bleeding, the family would like to forego TPA because of the patient's age.  However, when the CTA came back with a positive occlusion in the M2 the family said they would be amenable to thrombectomy.  I discussed the case with Dr. Carman Ching as well as Dr. Lucky Rathke at [facility].  Dr. Lucky Rathke accepts the patient.  The patient will be transferred emergency traffic.  Family is understanding as well as the patient is understanding of the plan willing to comply. ____________________________________________   FINAL CLINICAL IMPRESSION(S) / ED DIAGNOSES  CVA    NEW MEDICATIONS STARTED DURING THIS VISIT:  New Prescriptions   No medications on file     Note:  This document was prepared using Dragon voice recognition software and may include unintentional dictation errors.     Orbie Pyo, MD 02/02/2018 2056

## 2018-01-31 NOTE — ED Notes (Signed)
Patient transported to CT 

## 2018-01-31 NOTE — ED Notes (Signed)
Report called to [place], RN with [facility].

## 2018-01-31 NOTE — ED Notes (Signed)
Pt transferred at this time to [facility].

## 2018-01-31 NOTE — ED Triage Notes (Signed)
Pt presents to [facility] with [facility] as transfer from [facility] for continued stroke care; at [facility] patient had NIH 6 and family declined tPA administration; pt arrived to [facility] ED and Dr. Lorraine Lax assessed patient at ER bridge and now has NIH 2; decision not to take patient to IR was made by Aroor and Earleen Newport; will continue to monitor with q60min neuro checks until 12 hrs past LSN and then q1hr until 24 hrs past LSN

## 2018-01-31 NOTE — ED Provider Notes (Deleted)
Patient initially accepted to Neurology stroke service for interventional thrombectomy, but NIH 2 on arrival. Dr. Lorraine Lax at bedside, discussed with family. Thrombectomy cancelled, plan to follow per Dr. Lorraine Lax. I reviewed vitals, labs, pt stable. Added on UA given ? Of recent UTI. Protecting airway.  ROS negative. On exam, pt is in NAD. Heart RRR. Lungs CTAB. Protecting airway, no focal neuro deficits. Extremities warm, well perfused.  Plan: Admit to Neuro ICU. Hold on intervention currently.   Duffy Bruce, MD 02/21/2018 Otila Back    Duffy Bruce, MD Mar 01, 2018 [phone]

## 2018-01-31 NOTE — ED Notes (Signed)
Dr. Lorraine Lax at bedside talking with family about plan of care; family reporting that patients symptoms are intermittent and d/t symptoms being mild currently with an NIH of 3 (facial droop, expressive aphasia, disarthria) decision was made between Aroor and Earleen Newport not to do thrombectomy; pt updated on plan; Dr. Lorraine Lax wishes to have q35min x 12 hrs then q1hr x 24 hrs

## 2018-01-31 NOTE — H&P (Signed)
Chief Complaint: Aphasia  History obtained from: Patient and Chart    HPI:                                                                                                                                       Kaitlyn Bauer is an 84 y.o. female with PMH of HTN. Afib not on anticoagulation presents to [facility] as stroke alert after family found to her to sudden onset aphasia and facial droop around 2.30 pm. Patient lives by herself at home and has good functional baseline.   Initially NIHSS of 4 by EDP, however this increased to 7 wth worsening aphasia when evaluated by tele neurologist. TPA was offered, however family declined after discussing risk vs benefit according to teleneurologist's note. CTA showed Left m2 occlusion and CT perfusion show 0 core and 13 cc penumbra.    Dr Earleen Newport, Neurointerventional radiologist at [facility] was called directly by tele neruologist. I was called by EDP and after talking with family over the phone and confirming with family consent for intervention, patient transferred as a code stroke to [facility].   However on arrival patient NIHSS only 2 for facial droop and minimal aphasia (later scored 3 for mild dysarthria). Patient able to read, no neglect or weakness. Discussed case with Dr. Earleen Newport and mechanical thrombectomy was cancelled.   Date last known well: 7.10.19 Time last known well: 2.30 pm tPA Given: no, family declined  NIHSS: 3 Baseline MRS 0    Past Medical History:  Diagnosis Date  . Hypertension     Past Surgical History:  Procedure Laterality Date  . ABDOMINAL HYSTERECTOMY    . APPENDECTOMY    . CHOLECYSTECTOMY      Family History  Family history unknown: Yes   Social History:  reports that she has quit smoking. She has never used smokeless tobacco. She reports that she does not drink alcohol or use drugs.  Allergies:  Allergies  Allergen Reactions  . Penicillins Hives    Has patient had a PCN  reaction causing immediate rash, facial/tongue/throat swelling, SOB or lightheadedness with hypotension: Unknown Has patient had a PCN reaction causing severe rash involving mucus membranes or skin necrosis: Unknown Has patient had a PCN reaction that required hospitalization: Unknown Has patient had a PCN reaction occurring within the last 10 years: Unknown If all of the above answers are "NO", then may proceed with Cephalosporin use.     Medications:  I reviewed home medications   ROS:                                                                                                                                     14 systems reviewed and negative except above    Examination:                                                                                                      General: Appears well-developed and well-nourished.  Psych: Affect appropriate to situation Eyes: No scleral injection HENT: No OP obstrucion Head: Normocephalic.  Cardiovascular: Normal rate and regular rhythm.  Respiratory: Effort normal and breath sounds normal to anterior ascultation GI: Soft.  No distension. There is no tenderness.  Skin: WDI    Neurological Examination Mental Status: Alert, oriented, thought content appropriate.  Speech fluent with mild aphasia and some difficulty naming. Mild dysarthria.  Able to follow 3 step commands without difficulty. Cranial Nerves: II: Visual fields grossly normal,  III,IV, VI: ptosis not present, extra-ocular motions intact bilaterally, pupils equal, round, reactive to light and accommodation V,VII: mild R NFF,  facial light touch sensation normal bilaterally VIII: hearing normal bilaterally IX,X: uvula rises symmetrically XI: bilateral shoulder shrug XII: midline tongue extension Motor: Right : Upper extremity   5/5    Left:     Upper  extremity   5/5  Lower extremity   5/5     Lower extremity   5/5 Tone and bulk:normal tone throughout; no atrophy noted Sensory: Pinprick and light touch intact throughout, bilaterally Deep Tendon Reflexes: 2+ and symmetric throughout Plantars: Right: downgoing   Left: downgoing Cerebellar: normal finger-to-nose, normal rapid alternating movements and normal heel-to-shin test Gait: normal gait and station     Lab Results: Basic Metabolic Panel: Recent Labs  Lab 02/03/2018 1712  NA 140  K 2.9*  CL 103  CO2 28  GLUCOSE 202*  BUN 14  CREATININE 0.96  CALCIUM 9.0    CBC: Recent Labs  Lab 02/01/2018 1712  WBC 13.2*  NEUTROABS 5.4  HGB 13.5  HCT 39.4  MCV 93.0  PLT 208    Coagulation Studies: Recent Labs    02/21/2018 1712  LABPROT 13.2  INR 1.01    Imaging: Ct Angio Head W Or Wo Contrast  Result Date: 02/02/2018 CLINICAL DATA:  84 y/o F; slurred speech, extremity weakness, and confusion, symptoms resolved. EXAM: CT ANGIOGRAPHY HEAD AND NECK CT PERFUSION BRAIN TECHNIQUE: Multidetector CT imaging of the head  and neck was performed using the standard protocol during bolus administration of intravenous contrast. Multiplanar CT image reconstructions and MIPs were obtained to evaluate the vascular anatomy. Carotid stenosis measurements (when applicable) are obtained utilizing NASCET criteria, using the distal internal carotid diameter as the denominator. Multiphase CT imaging of the brain was performed following IV bolus contrast injection. Subsequent parametric perfusion maps were calculated using RAPID software. CONTRAST:  146mL ISOVUE-370 IOPAMIDOL (ISOVUE-370) INJECTION 76% COMPARISON:  02/05/2018 CT head.  07/28/2017 carotid duplex. FINDINGS: CTA NECK FINDINGS Aortic arch: Standard branching. Imaged portion shows no evidence of aneurysm or dissection. No significant stenosis of the major arch vessel origins. Right carotid system: No evidence of dissection, stenosis (50% or  greater) or occlusion. Left carotid system: No evidence of dissection, stenosis (50% or greater) or occlusion. Vertebral arteries: Codominant. No evidence of dissection, stenosis (50% or greater) or occlusion. Skeleton: Mild cervical spondylosis. C6-7 grade 1 anterolisthesis with prominent facet arthropathy. No high-grade bony canal stenosis. Other neck: 15 mm nodule in the right lobe of thyroid (series 6, image 74). Upper chest: Negative. Review of the MIP images confirms the above findings CTA HEAD FINDINGS Anterior circulation: 6 mm anteriorly directed aneurysm arising from the distal horizontal petrous right ICA (series 7, image 211 and series 9, image 59). Left MCA M2 inferior division short segment distal occlusion/near occlusion at the level of the dorsal insula (series 12, image 23), with patent downstream vessel. No additional large vessel occlusion, aneurysm, or high-grade stenosis. Posterior circulation: Mild left P1 stenosis. No significant proximal stenosis, proximal occlusion, aneurysm, or vascular malformation. Segments of stenosis in the distal PCA circulation bilaterally. Venous sinuses: As permitted by contrast timing, patent. Anatomic variants: Anterior communicating artery and left posterior communicating arteries are present. No right posterior communicating artery identified, likely hypoplastic or absent. Delayed phase: No abnormal intracranial enhancement. Review of the MIP images confirms the above findings CT Brain Perfusion Findings: CBF (<30%) Volume: 44mL Perfusion (Tmax>6.0s) volume: 13 ccmL Mismatch Volume: 13 ccmL Infarction Location:Left parietal lobe oligemia. No infarct by standard perfusion criteria. IMPRESSION: 1. Left parietal lobe oligemia with ischemic penumbra of 13 cc. No acute infarct by standard perfusion criteria. 2. Left M2 inferior division distal short segment of occlusion/near occlusion with patent downstream vessel. 3. 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 4. 15 mm nodule in the right lobe of thyroid gland, thyroid ultrasound is recommended on a nonemergent basis. These results were called by telephone at the time of interpretation on 02/16/2018 at 7:33 pm to Dr. Larae Grooms , who verbally acknowledged these results. Electronically Signed   By: Kristine Garbe M.D.   On: 02/01/2018 19:39   Ct Angio Neck W Or Wo Contrast  Result Date: 01/30/2018 CLINICAL DATA:  84 y/o F; slurred speech, extremity weakness, and confusion, symptoms resolved. EXAM: CT ANGIOGRAPHY HEAD AND NECK CT PERFUSION BRAIN TECHNIQUE: Multidetector CT imaging of the head and neck was performed using the standard protocol during bolus administration of intravenous contrast. Multiplanar CT image reconstructions and MIPs were obtained to evaluate the vascular anatomy. Carotid stenosis measurements (when applicable) are obtained utilizing NASCET criteria, using the distal internal carotid diameter as the denominator. Multiphase CT imaging of the brain was performed following IV bolus contrast injection. Subsequent parametric perfusion maps were calculated using RAPID software. CONTRAST:  164mL ISOVUE-370 IOPAMIDOL (ISOVUE-370) INJECTION 76% COMPARISON:  02/20/2018 CT head.  07/28/2017 carotid duplex. FINDINGS: CTA NECK FINDINGS Aortic arch: Standard branching. Imaged portion  shows no evidence of aneurysm or dissection. No significant stenosis of the major arch vessel origins. Right carotid system: No evidence of dissection, stenosis (50% or greater) or occlusion. Left carotid system: No evidence of dissection, stenosis (50% or greater) or occlusion. Vertebral arteries: Codominant. No evidence of dissection, stenosis (50% or greater) or occlusion. Skeleton: Mild cervical spondylosis. C6-7 grade 1 anterolisthesis with prominent facet arthropathy. No high-grade bony canal stenosis. Other neck: 15 mm nodule in the right lobe of thyroid (series 6, image 74).  Upper chest: Negative. Review of the MIP images confirms the above findings CTA HEAD FINDINGS Anterior circulation: 6 mm anteriorly directed aneurysm arising from the distal horizontal petrous right ICA (series 7, image 211 and series 9, image 59). Left MCA M2 inferior division short segment distal occlusion/near occlusion at the level of the dorsal insula (series 12, image 23), with patent downstream vessel. No additional large vessel occlusion, aneurysm, or high-grade stenosis. Posterior circulation: Mild left P1 stenosis. No significant proximal stenosis, proximal occlusion, aneurysm, or vascular malformation. Segments of stenosis in the distal PCA circulation bilaterally. Venous sinuses: As permitted by contrast timing, patent. Anatomic variants: Anterior communicating artery and left posterior communicating arteries are present. No right posterior communicating artery identified, likely hypoplastic or absent. Delayed phase: No abnormal intracranial enhancement. Review of the MIP images confirms the above findings CT Brain Perfusion Findings: CBF (<30%) Volume: 84mL Perfusion (Tmax>6.0s) volume: 13 ccmL Mismatch Volume: 13 ccmL Infarction Location:Left parietal lobe oligemia. No infarct by standard perfusion criteria. IMPRESSION: 1. Left parietal lobe oligemia with ischemic penumbra of 13 cc. No acute infarct by standard perfusion criteria. 2. Left M2 inferior division distal short segment of occlusion/near occlusion with patent downstream vessel. 3. 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 4. 15 mm nodule in the right lobe of thyroid gland, thyroid ultrasound is recommended on a nonemergent basis. These results were called by telephone at the time of interpretation on 02/13/2018 at 7:33 pm to Dr. Larae Grooms , who verbally acknowledged these results. Electronically Signed   By: Kristine Garbe M.D.   On: 02/13/2018 19:39   Ct Cerebral Perfusion W  Contrast  Result Date: 02/09/2018 CLINICAL DATA:  84 y/o F; slurred speech, extremity weakness, and confusion, symptoms resolved. EXAM: CT ANGIOGRAPHY HEAD AND NECK CT PERFUSION BRAIN TECHNIQUE: Multidetector CT imaging of the head and neck was performed using the standard protocol during bolus administration of intravenous contrast. Multiplanar CT image reconstructions and MIPs were obtained to evaluate the vascular anatomy. Carotid stenosis measurements (when applicable) are obtained utilizing NASCET criteria, using the distal internal carotid diameter as the denominator. Multiphase CT imaging of the brain was performed following IV bolus contrast injection. Subsequent parametric perfusion maps were calculated using RAPID software. CONTRAST:  172mL ISOVUE-370 IOPAMIDOL (ISOVUE-370) INJECTION 76% COMPARISON:  02/17/2018 CT head.  07/28/2017 carotid duplex. FINDINGS: CTA NECK FINDINGS Aortic arch: Standard branching. Imaged portion shows no evidence of aneurysm or dissection. No significant stenosis of the major arch vessel origins. Right carotid system: No evidence of dissection, stenosis (50% or greater) or occlusion. Left carotid system: No evidence of dissection, stenosis (50% or greater) or occlusion. Vertebral arteries: Codominant. No evidence of dissection, stenosis (50% or greater) or occlusion. Skeleton: Mild cervical spondylosis. C6-7 grade 1 anterolisthesis with prominent facet arthropathy. No high-grade bony canal stenosis. Other neck: 15 mm nodule in the right lobe of thyroid (series 6, image 74). Upper chest: Negative. Review of the MIP images confirms the  above findings CTA HEAD FINDINGS Anterior circulation: 6 mm anteriorly directed aneurysm arising from the distal horizontal petrous right ICA (series 7, image 211 and series 9, image 59). Left MCA M2 inferior division short segment distal occlusion/near occlusion at the level of the dorsal insula (series 12, image 23), with patent downstream  vessel. No additional large vessel occlusion, aneurysm, or high-grade stenosis. Posterior circulation: Mild left P1 stenosis. No significant proximal stenosis, proximal occlusion, aneurysm, or vascular malformation. Segments of stenosis in the distal PCA circulation bilaterally. Venous sinuses: As permitted by contrast timing, patent. Anatomic variants: Anterior communicating artery and left posterior communicating arteries are present. No right posterior communicating artery identified, likely hypoplastic or absent. Delayed phase: No abnormal intracranial enhancement. Review of the MIP images confirms the above findings CT Brain Perfusion Findings: CBF (<30%) Volume: 55mL Perfusion (Tmax>6.0s) volume: 13 ccmL Mismatch Volume: 13 ccmL Infarction Location:Left parietal lobe oligemia. No infarct by standard perfusion criteria. IMPRESSION: 1. Left parietal lobe oligemia with ischemic penumbra of 13 cc. No acute infarct by standard perfusion criteria. 2. Left M2 inferior division distal short segment of occlusion/near occlusion with patent downstream vessel. 3. 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 4. 15 mm nodule in the right lobe of thyroid gland, thyroid ultrasound is recommended on a nonemergent basis. These results were called by telephone at the time of interpretation on 02/19/2018 at 7:33 pm to Dr. Larae Grooms , who verbally acknowledged these results. Electronically Signed   By: Kristine Garbe M.D.   On: 02/11/2018 19:39   Ct Head Code Stroke Wo Contrast  Result Date: 02/01/2018 CLINICAL DATA:  Code stroke. 84 y/o F; slurred speech, weakness, confusion. EXAM: CT HEAD WITHOUT CONTRAST TECHNIQUE: Contiguous axial images were obtained from the base of the skull through the vertex without intravenous contrast. COMPARISON:  07/27/2017 CT head. FINDINGS: Brain: No evidence of acute infarction, hemorrhage, hydrocephalus, extra-axial collection or mass  lesion/mass effect. Partially empty sella turcica. New tiny lucency within the left posterior frontal periventricular white matter compatible with chronic microvascular ischemic change. Additional chronic microvascular ischemic changes and parenchymal volume loss of the brain on prior CT of head are stable. Vascular: Calcific atherosclerosis of carotid siphons. No hyperdense vessel identified. Skull: Normal. Negative for fracture or focal lesion. Sinuses/Orbits: No acute finding. Other: Bilateral intra-ocular lens replacement. ASPECTS [facility] Stroke Program Early CT Score) - Ganglionic level infarction (caudate, lentiform nuclei, internal capsule, insula, M1-M3 cortex): 7 - Supraganglionic infarction (M4-M6 cortex): 3 Total score (0-10 with 10 being normal): 10 IMPRESSION: 1. No acute intracranial abnormality identified. 2. ASPECTS is 10. 3. Chronic microvascular ischemic changes and parenchymal volume loss of the brain. These results were called by telephone at the time of interpretation on 02/11/2018 at 5:30 pm to Dr. Larae Grooms , who verbally acknowledged these results. Electronically Signed   By: Kristine Garbe M.D.   On: 02/11/2018 17:32     ASSESSMENT AND PLAN   84 y.o. female with PMH of HTN. Afib not on anticoagulation with good functional baseline presents to [facility] as stroke alert after family found to her to sudden onset aphasia and facial droop around 2.30 pm. CTA shows Left M2 occlusion with some distal reconstitution. Family refused tPA. Transferred for EMT.  Patient clinically improved and hence we decided not to proceed with mechanical throbectomy. Will watch her closely and if she worsens to NIHSS >6 will revisit decision to take her to [facility].    Left  MCA Acute Ischemic Stroke with Left M2 occlusion  Risk factors: HTN, afib Etiology: cardioembolic   Recommend # Close neurochecks - if patient declines will reconsider [facility] # MRI of the brain without  contrast #Transthoracic Echo  # Start patient on ASA 325mg  daily #Start or continue Atorvastatin 80 mg/other high intensity statin # BP goal: permissive HTN upto 220/110 mmHg # HBAIC and Lipid profile # Telemetry monitoring # Frequent neuro checks # NPO until passes stroke swallow screen   Atrial fibrillation CHADS2VASC2 score is 6, putting her at annual 10% risk of stroke  Currently not in RVR Will likely need to be started on anticoagulation    This patient is neurologically critically ill due to Left MCA stroke with LVO.   She is at risk for significant risk of neurological worsening from cerebral edema,  death from brain herniation, heart failure, hemorrhagic conversion, infection, respiratory failure and seizure. This patient's care requires constant monitoring of vital signs, hemodynamics, respiratory and cardiac monitoring, review of multiple databases, neurological assessment, discussion with family, other specialists and medical decision making of high complexity.  I spent 70  minutes of neurocritical time in the care of this patient.       Please page stroke NP  Or  PA  Or MD from 8am -4 pm  as this patient from this time will be  followed by the stroke.   You can look them up on [email or web address]  Password [facility]    Sushanth Aroor [facility] Pager Number [phone]

## 2018-01-31 NOTE — Progress Notes (Signed)
CODE STROKE- PHARMACY COMMUNICATION   Time CODE STROKE called/page received:[id number]  Time response to CODE STROKE was made (in person or via phone): 1730  Time Stroke Kit retrieved from [place] (only if needed):  Name of Provider/Nurse contacted:Jerri  Past Medical History:  Diagnosis Date  . Hypertension    Prior to Admission medications   Medication Sig Start Date End Date Taking? Authorizing Provider  Cholecalciferol (VITAMIN D-1000 MAX ST) 1000 units tablet Take 1,000 Units by mouth daily.   Yes [provider]  Multiple Vitamin (MULTI-VITAMINS) TABS Take 1 tablet by mouth daily.   Yes [provider]  nitrofurantoin, macrocrystal-monohydrate, (MACROBID) 100 MG capsule Take 100 mg by mouth 2 (two) times daily. 01/25/2018 02/07/18 Yes [provider]  amLODipine (NORVASC) 10 MG tablet Take 1 tablet (10 mg total) by mouth daily. 07/30/17   Epifanio Lesches, MD  aspirin 325 MG EC tablet Take 325 mg by mouth as needed (arthritis).    [provider]  doxycycline (VIBRAMYCIN) 100 MG capsule Take 1 capsule (100 mg total) by mouth 2 (two) times daily. 07/30/17   Epifanio Lesches, MD  hydrALAZINE (APRESOLINE) 25 MG tablet Take 1 tablet (25 mg total) by mouth 3 (three) times daily. 07/30/17 07/30/18  Epifanio Lesches, MD    Napoleon Form ,PharmD Clinical Pharmacist  02/02/2018  7:13 PM

## 2018-01-31 NOTE — ED Notes (Signed)
CODE STROKE CALLED TO [phone] 

## 2018-01-31 NOTE — Progress Notes (Signed)
Chaplain met family of the patient at ED-11. Patient was out of the room getting at CT Scan. Chaplain discussed the patient's situation with daughters, Annabell Sabal, and Kendrick Fries. The family was calm, but vigilant. As patient returned, Chaplain provided silent, energetic prayer for the patient and family.  The patient began a videoconference assessment and Chaplain thanked the family and offered to return as needed.

## 2018-01-31 NOTE — Progress Notes (Addendum)
[facility] TeleNeurology Consult Services  Impression:  Stroke r/o left MCA territory infarct. Her confusion may be also attributable to concurrent UTI infection. She is able to follow some simple commands but impaired ability to articulate and confused as to the names of the daughters. Typically her baseline is AAOx4.  Patient is a tpa candidate, LSN 2pm, after long discussion with family pros and cons fo therapy, they decided to proceed without it given her age and high risk of mortality. They were explained that she may have permanent speech impairment and they understood.  Symptoms are consistent with LVO therefore will proceed with CTA head and neck imaging.   CTA head: 1. Left parietal lobe oligemia with ischemic penumbra of 13 cc. No acute infarct by standard perfusion criteria. 2. Left M2 inferior division distal short segment of occlusion/near occlusion with patent downstream vessel. 3. 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 4. 15 mm nodule in the right lobe of thyroid gland, thyroid ultrasound is recommended on a nonemergent basis.  Case d/w NIR Dr. Earleen Newport, will transfer patient to [facility] to discuss intervention options with family.  ED attending made aware and will initiate transfer.   Differential Diagnosis:   1. Cardioembolic stroke  2. Small vessel disease/lacune  3. Thromboembolic, artery-to-artery mechanism  4. Hypercoagulable state-related infarct  5. Transient ischemic attack  6. Thrombotic mechanism, large artery disease   Comments:   [facility] contacted: 17:43 [facility] at bedside: 17:50 NIHSS assessment time: 18:15  Recommendations:  -MRI brain -2d echo -tele -antiplatelet agent  -antibiotcs for UTI -neuro checks -NPO -HOB flat -permissive HTN -DVT prophylaxis -fasting lipid panel and HgA1C -PT/OT and speech Inpatient neuro consult   Discussed with ED MD Please call with  questions  -----------------------------------------------------------------------------------------  CC aphasia  History of Present Illness   Patient is a  84 yr old with hx of HTN, falls and recent hematoma in January who presented after an onset of aphasia, right facial droop and slurred speech. Initially she was well until 2pm, she had lunch and had a doctors appointment with PCP. While there, she was diagnosed with UTI and suddenly developed an onset of confusion, slurred speech and facial assymetry on the right. She improved initially. Then per EMS en route to the hospital began experiencing aphasia and slurred speech again. She was following some commands and answering certain questions. Did not report any other complaints, no weakness, numbness, no visual changes, no pain, no CP, SOB.   Diagnostic: CT chronic changes, ASOECTS 10  Exam: 1a- LOC: Keenly responsive - 0   1b- LOC questions: Answers both questions correctly - 2     1c- LOC commands- Performs both tasks correctly- 1     2- Gaze: Normal; no gaze paresis or gaze deviation - 0     3- Visual Fields: normal, no Visual field deficit - 0     4- Facial movements: no facial palsy - 0     5- Upper limb motor - no drift -0     6- Lower limb motor - no drift - 0      7- Limb Coordination: absent ataxia - 0      8- Sensory : No  sensory loss - 0     9- Language - expressive aphasia - 2      10- Speech - + dysarthria -2     11- Neglect / Extinction - none found -0     NIHSS score  7    Medical Decision Making:  - Extensive number of diagnosis or management options are considered above.   - Extensive amount of complex data reviewed.   - High risk of complication and/or morbidity or mortality are associated with differential diagnostic considerations above.  - There may be Uncertain outcome and increased probability of prolonged functional impairment or high probability of severe prolonged functional impairment associated with  some of these differential diagnosis.  Medical Data Reviewed:  1.Data reviewed include clinical labs, radiology,  Medical Tests;   2.Tests results discussed w/performing or interpreting physician;   3.Obtaining/reviewing old medical records;  4.Obtaining case history from another source;  5.Independent review of image, tracing or specimen.    Patient was informed the Neurology Consult would happen via telehealth (remote video) and consented to receiving care in this manner.

## 2018-02-01 ENCOUNTER — Inpatient Hospital Stay (HOSPITAL_COMMUNITY): Payer: Medicare HMO

## 2018-02-01 DIAGNOSIS — I351 Nonrheumatic aortic (valve) insufficiency: Secondary | ICD-10-CM

## 2018-02-01 LAB — BASIC METABOLIC PANEL
Anion gap: 12 (ref 5–15)
BUN: 5 mg/dL — ABNORMAL LOW (ref 8–23)
CHLORIDE: 105 mmol/L (ref 98–111)
CO2: 24 mmol/L (ref 22–32)
CREATININE: 0.71 mg/dL (ref 0.44–1.00)
Calcium: 9 mg/dL (ref 8.9–10.3)
GFR calc Af Amer: >60 mL/min (ref >60)
GFR calc non Af Amer: >60 mL/min (ref >60)
GLUCOSE: 111 mg/dL — AB (ref 70–99)
Potassium: 3.6 mmol/L (ref 3.5–5.1)
Sodium: 141 mmol/L (ref 135–145)

## 2018-02-01 LAB — MRSA PCR SCREENING: MRSA by PCR: NEGATIVE

## 2018-02-01 LAB — ECHOCARDIOGRAM COMPLETE
HEIGHTINCHES: 61 in
Weight: 2130.53 oz

## 2018-02-01 LAB — CREATININE, SERUM
CREATININE: 0.88 mg/dL (ref 0.44–1.00)
GFR, EST NON AFRICAN AMERICAN: 55 mL/min — AB (ref >60)

## 2018-02-01 LAB — CBC
HEMATOCRIT: 42.3 % (ref 36.0–46.0)
Hemoglobin: 14 g/dL (ref 12.0–15.0)
MCH: 30.6 pg (ref 26.0–34.0)
MCHC: 33.1 g/dL (ref 30.0–36.0)
MCV: 92.4 fL (ref 78.0–100.0)
PLATELETS: 232 10*3/uL (ref 150–400)
RBC: 4.58 MIL/uL (ref 3.87–5.11)
RDW: 14 % (ref 11.5–15.5)
WBC: 15.6 10*3/uL — AB (ref 4.0–10.5)

## 2018-02-01 LAB — LIPID PANEL
Cholesterol: 160 mg/dL (ref 0–200)
HDL: 43 mg/dL (ref >40)
LDL Cholesterol: 98 mg/dL (ref 0–99)
TRIGLYCERIDES: 95 mg/dL (ref <150)
Total CHOL/HDL Ratio: 3.7 RATIO
VLDL: 19 mg/dL (ref 0–40)

## 2018-02-01 LAB — HEMOGLOBIN A1C
HEMOGLOBIN A1C: 5.9 % — AB (ref 4.8–5.6)
Mean Plasma Glucose: 122.63 mg/dL

## 2018-02-01 MED ORDER — SODIUM CHLORIDE 0.9 % IV SOLN
INTRAVENOUS | Status: DC
  Administered 2018-02-01: 250 mL via INTRAVENOUS

## 2018-02-01 MED ORDER — DOXYCYCLINE HYCLATE 100 MG PO TABS
100.0000 mg | ORAL_TABLET | Freq: Two times a day (BID) | ORAL | Status: DC
  Administered 2018-02-01 – 2018-02-06 (×8): 100 mg via ORAL
  Filled 2018-02-01 (×9): qty 1

## 2018-02-01 MED ORDER — APIXABAN 2.5 MG PO TABS
2.5000 mg | ORAL_TABLET | Freq: Two times a day (BID) | ORAL | Status: DC
  Administered 2018-02-01 – 2018-02-07 (×12): 2.5 mg via ORAL
  Filled 2018-02-01 (×14): qty 1

## 2018-02-01 MED ORDER — AMLODIPINE BESYLATE 10 MG PO TABS
10.0000 mg | ORAL_TABLET | Freq: Every day | ORAL | Status: DC
  Administered 2018-02-02 – 2018-02-11 (×7): 10 mg via ORAL
  Filled 2018-02-01 (×8): qty 1

## 2018-02-01 MED ORDER — ONDANSETRON HCL 4 MG/2ML IJ SOLN
INTRAMUSCULAR | Status: AC
  Filled 2018-02-01: qty 2

## 2018-02-01 MED ORDER — ADULT MULTIVITAMIN W/MINERALS CH
1.0000 | ORAL_TABLET | Freq: Every day | ORAL | Status: DC
  Filled 2018-02-01 (×3): qty 1

## 2018-02-01 MED ORDER — NITROFURANTOIN MONOHYD MACRO 100 MG PO CAPS
100.0000 mg | ORAL_CAPSULE | Freq: Two times a day (BID) | ORAL | Status: DC
  Administered 2018-02-01 – 2018-02-03 (×4): 100 mg via ORAL
  Filled 2018-02-01 (×5): qty 1

## 2018-02-01 MED ORDER — ONDANSETRON HCL 4 MG/2ML IJ SOLN
4.0000 mg | Freq: Once | INTRAMUSCULAR | Status: AC
  Administered 2018-02-01: 4 mg via INTRAVENOUS

## 2018-02-01 MED ORDER — POTASSIUM CHLORIDE 10 MEQ/100ML IV SOLN
10.0000 meq | INTRAVENOUS | Status: AC
  Administered 2018-02-01 (×3): 10 meq via INTRAVENOUS
  Filled 2018-02-01 (×3): qty 100

## 2018-02-01 MED ORDER — HYDRALAZINE HCL 25 MG PO TABS
25.0000 mg | ORAL_TABLET | Freq: Three times a day (TID) | ORAL | Status: DC
  Administered 2018-02-01 – 2018-02-11 (×20): 25 mg via ORAL
  Filled 2018-02-01 (×26): qty 1

## 2018-02-01 MED ORDER — ATORVASTATIN CALCIUM 10 MG PO TABS
10.0000 mg | ORAL_TABLET | Freq: Every day | ORAL | Status: DC
  Administered 2018-02-02 – 2018-02-10 (×6): 10 mg via ORAL
  Filled 2018-02-01 (×8): qty 1

## 2018-02-01 NOTE — Progress Notes (Addendum)
OT Cancellation Note  Patient Details Name: Kaitlyn Bauer MRN: [id number] DOB: 12-07-1921   Cancelled Treatment:    Reason Eval/Treat Not Completed: Patient at procedure or test/ unavailable(Echo), and then transferring units within hospital.  Kaitlyn Bauer 02/01/2018, 2:27 PM  Hulda Humphrey OTR/L [phone]

## 2018-02-01 NOTE — Progress Notes (Signed)
PT Cancellation Note  Patient Details Name: Kaitlyn Bauer MRN: [id number] DOB: [birth year]   Cancelled Treatment:    Reason Eval/Treat Not Completed: Medical issues which prohibited therapy; patient c/o not feeling well, was nauseated and vomited earlier, RN medicated and pt now in new room, but still not feeling up to mobility this pm.  Will attempt another day.   Reginia Naas 02/01/2018, 4:51 PM  Magda Kiel, [place] 02/01/2018

## 2018-02-01 NOTE — Evaluation (Signed)
Speech Language Pathology Evaluation Patient Details Name: Kaitlyn Bauer MRN: [id number] DOB: 1921/12/03 Today's Date: 02/01/2018 Time:  -     Problem List:  Patient Active Problem List   Diagnosis Date Noted  . Acute ischemic left MCA stroke ([place]) 02/20/2018  . Fall 07/27/2017   Past Medical History:  Past Medical History:  Diagnosis Date  . Hypertension    Past Surgical History:  Past Surgical History:  Procedure Laterality Date  . ABDOMINAL HYSTERECTOMY    . APPENDECTOMY    . CHOLECYSTECTOMY     HPI:  Kaitlyn Bauer is a 84 y.o. female with history of HTN and AF not on AC presenting to [facility] with aphasia and facial droop. famiy refused tPA. Improved upon arrival to [facility] so IR not indicated. Multiple left MCA infarcts embolic secondary to known atrial fibrillation   Assessment / Plan / Recommendation Clinical Impression  Pt demosntrates one instance of word finding error throughout assessment, could not reproduce despite challenge. Pt subjectively dysarthric to unfamiliar listener, but not family. Do not recommend SLP f/u given minimal language impairment not impacting QOL. Pt also reports no desire for f/u. Will sign off.     SLP Assessment  SLP Recommendation/Assessment: Patient does not need any further Speech Lanaguage Pathology Services SLP Visit Diagnosis: Aphasia (R47.01)    Follow Up Recommendations       Frequency and Duration           SLP Evaluation Cognition  Overall Cognitive Status: Within Functional Limits for tasks assessed Orientation Level: Oriented X4       Comprehension  Auditory Comprehension Overall Auditory Comprehension: Appears within functional limits for tasks assessed Yes/No Questions: Within Functional Limits Commands: Within Functional Limits Conversation: Complex Reading Comprehension Reading Status: Within funtional limits    Expression Verbal Expression Overall Verbal Expression: Impaired Initiation: No  impairment Automatic Speech: Name;Social Response Level of Generative/Spontaneous Verbalization: Conversation Repetition: No impairment Naming: No impairment Pragmatics: No impairment Other Verbal Expression Comments: mild semantic errors in conversation   Oral / Motor  Oral Motor/Sensory Function Overall Oral Motor/Sensory Function: Mild impairment Facial ROM: Reduced right;Suspected CN VII (facial) dysfunction Facial Symmetry: Abnormal symmetry right;Suspected CN VII (facial) dysfunction Facial Strength: Reduced right;Suspected CN VII (facial) dysfunction Motor Speech Overall Motor Speech: Impaired Respiration: Within functional limits Phonation: Normal Resonance: Within functional limits Articulation: Impaired Level of Impairment: Conversation Intelligibility: Intelligible Motor Planning: Witnin functional limits Motor Speech Errors: Consistent   GO                   [employer], MA CCC-SLP [phone]  Lynann Beaver 02/01/2018, 3:39 PM

## 2018-02-01 NOTE — Progress Notes (Signed)
Patient admitted to 3W22. Oriented to room. Family at bedside.

## 2018-02-01 NOTE — Progress Notes (Signed)
ANTICOAGULATION CONSULT NOTE - Initial Consult  Pharmacy Consult for apixaban dosing Indication: atrial fibrillation  Allergies  Allergen Reactions  . Penicillins Hives    Has patient had a PCN reaction causing immediate rash, facial/tongue/throat swelling, SOB or lightheadedness with hypotension: Unknown Has patient had a PCN reaction causing severe rash involving mucus membranes or skin necrosis: Unknown Has patient had a PCN reaction that required hospitalization: Unknown Has patient had a PCN reaction occurring within the last 10 years: Unknown If all of the above answers are "NO", then may proceed with Cephalosporin use.     Patient Measurements: Height: 5\' 1"  (154.9 cm) Weight: 133 lb 2.5 oz (60.4 kg) IBW/kg (Calculated) : 47.8  Vital Signs: Temp: 98.5 F (36.9 C) (07/11 0800) Temp Source: Oral (07/11 0800) BP: 146/117 (07/11 1000) Pulse Rate: 96 (07/11 1000)  Labs: Recent Labs    02/16/2018 1712 02/01/18 0048 02/01/18 0909  HGB 13.5 14.0  --   HCT 39.4 42.3  --   PLT 208 232  --   APTT 27  --   --   LABPROT 13.2  --   --   INR 1.01  --   --   CREATININE 0.96 0.88 0.71  TROPONINI <0.03  --   --     Estimated Creatinine Clearance: 36.6 mL/min (by C-G formula based on SCr of 0.71 mg/dL).   Medical History: Past Medical History:  Diagnosis Date  . Hypertension     Medications:  Medications Prior to Admission  Medication Sig Dispense Refill Last Dose  . amLODipine (NORVASC) 10 MG tablet Take 1 tablet (10 mg total) by mouth daily. 30 tablet 0 02/21/2018 at Unknown time  . aspirin 325 MG EC tablet Take 325 mg by mouth as needed (arthritis).   Past Month at Unknown time  . Cholecalciferol (VITAMIN D-1000 MAX ST) 1000 units tablet Take 1,000 Units by mouth daily.   Past Week at Unknown time  . doxycycline (VIBRAMYCIN) 100 MG capsule Take 1 capsule (100 mg total) by mouth 2 (two) times daily. 20 capsule 0 02/10/2018 at Unknown time  . hydrALAZINE (APRESOLINE) 25  MG tablet Take 1 tablet (25 mg total) by mouth 3 (three) times daily. 90 tablet 0 01/28/2018 at Unknown time  . Multiple Vitamin (MULTI-VITAMINS) TABS Take 1 tablet by mouth daily.   Past Week at Unknown time  . nitrofurantoin, macrocrystal-monohydrate, (MACROBID) 100 MG capsule Take 100 mg by mouth 2 (two) times daily.   02/18/2018 at Unknown time   Scheduled:  . atorvastatin  10 mg Oral q1800  . heparin  5,000 Units Subcutaneous Q8H    Assessment: Patient is a 84yo female with a PMH of HTN and atrial fibrillation. Admitted on 7/10 for stroke likely secondary to a fib. Patient was not on anticoagulation PTA. Pharmacy consulted for apixaban dosing. CBC stable within normal limits.  Goal of Therapy:  Monitor platelets by anticoagulation protocol: Yes   Plan:  Start apixaban 2.5 mg twice daily (84yo, 60kg)  Monitor bleeding and CBC F/U plan on whether to continue ASA on discharge  Kaitlyn Bauer O Kaitlyn Bauer 02/01/2018,11:10 AM

## 2018-02-01 NOTE — Discharge Instructions (Signed)

## 2018-02-01 NOTE — Progress Notes (Signed)
Patient is having some  Nausea and vomiting.  Nurse paged Dr. Leonie Man. twice No call back. Nurse will continue monitor.

## 2018-02-01 NOTE — Progress Notes (Signed)
  Echocardiogram 2D Echocardiogram has been performed.  Madelaine Etienne 02/01/2018, 2:25 PM

## 2018-02-01 NOTE — Progress Notes (Addendum)
STROKE TEAM PROGRESS NOTE  HPI ( Dr Aroor) : Kaitlyn Bauer is an 84 y.o. female with PMH of HTN. Afib not on anticoagulation presents to [facility] as stroke alert after family found to her to sudden onset aphasia and facial droop around 2.30 pm. Patient lives by herself at home and has good functional baseline.   Initially NIHSS of 4 by EDP, however this increased to 7 wth worsening aphasia when evaluated by tele neurologist. TPA was offered, however family declined after discussing risk vs benefit according to teleneurologist's note. CTA showed Left m2 occlusion and CT perfusion show 0 core and 13 cc penumbra.    Dr Earleen Newport, Neurointerventional radiologist at [facility] was called directly by tele neruologist. I was called by EDP and after talking with family over the phone and confirming with family consent for intervention, patient transferred as a code stroke to [facility].   However on arrival patient NIHSS only 2 for facial droop and minimal aphasia (later scored 3 for mild dysarthria). Patient able to read, no neglect or weakness. Discussed case with Dr. Earleen Newport and mechanical thrombectomy was cancelled.   Date last known well: 7.10.19 Time last known well: 2.30 pm tPA Given: no, family declined  NIHSS: 3 Baseline MRS 0   INTERVAL HISTORY Her daughter is at the bedside.  Feels pt back to baseline. Lives alone.She has no history of A. Fib but has not been on long-term anticoagulation. The reason for this is not clear. She did have a fall last November but states that her balance is good and she does not have frequent falls.  Vitals:   02/01/18 0500 02/01/18 0600 02/01/18 0700 02/01/18 0800  BP: (!) 160/73 (!) 174/90 (!) 181/80 (!) 157/75  Pulse: 77 85 88 82  Resp: 16 17 11  (!) 21  Temp:      TempSrc:      SpO2: 96% 97% 97% 95%  Weight:      Height:        CBC:  Recent Labs  Lab 02/04/2018 1712 02/01/18 0048  WBC 13.2* 15.6*  NEUTROABS 5.4  --    HGB 13.5 14.0  HCT 39.4 42.3  MCV 93.0 92.4  PLT 208 338    Basic Metabolic Panel:  Recent Labs  Lab 02/04/2018 1712 02/01/18 0048  NA 140  --   K 2.9*  --   CL 103  --   CO2 28  --   GLUCOSE 202*  --   BUN 14  --   CREATININE 0.96 0.88  CALCIUM 9.0  --    Lipid Panel:     Component Value Date/Time   CHOL 160 02/01/2018 0243   TRIG 95 02/01/2018 0243   HDL 43 02/01/2018 0243   CHOLHDL 3.7 02/01/2018 0243   VLDL 19 02/01/2018 0243   LDLCALC 98 02/01/2018 0243   HgbA1c:  Lab Results  Component Value Date   HGBA1C 5.9 (H) 02/01/2018   Urine Drug Screen: No results found for: LABOPIA, COCAINSCRNUR, LABBENZ, AMPHETMU, THCU, LABBARB  Alcohol Level No results found for: ETH  IMAGING Ct Angio Head W Or Wo Contrast  Result Date: 02/17/2018 CLINICAL DATA:  84 y/o F; slurred speech, extremity weakness, and confusion, symptoms resolved. EXAM: CT ANGIOGRAPHY HEAD AND NECK CT PERFUSION BRAIN TECHNIQUE: Multidetector CT imaging of the head and neck was performed using the standard protocol during bolus administration of intravenous contrast. Multiplanar CT image reconstructions and MIPs were obtained to evaluate the vascular  anatomy. Carotid stenosis measurements (when applicable) are obtained utilizing NASCET criteria, using the distal internal carotid diameter as the denominator. Multiphase CT imaging of the brain was performed following IV bolus contrast injection. Subsequent parametric perfusion maps were calculated using RAPID software. CONTRAST:  162mL ISOVUE-370 IOPAMIDOL (ISOVUE-370) INJECTION 76% COMPARISON:  02/11/2018 CT head.  07/28/2017 carotid duplex. FINDINGS: CTA NECK FINDINGS Aortic arch: Standard branching. Imaged portion shows no evidence of aneurysm or dissection. No significant stenosis of the major arch vessel origins. Right carotid system: No evidence of dissection, stenosis (50% or greater) or occlusion. Left carotid system: No evidence of dissection, stenosis (50%  or greater) or occlusion. Vertebral arteries: Codominant. No evidence of dissection, stenosis (50% or greater) or occlusion. Skeleton: Mild cervical spondylosis. C6-7 grade 1 anterolisthesis with prominent facet arthropathy. No high-grade bony canal stenosis. Other neck: 15 mm nodule in the right lobe of thyroid (series 6, image 74). Upper chest: Negative. Review of the MIP images confirms the above findings CTA HEAD FINDINGS Anterior circulation: 6 mm anteriorly directed aneurysm arising from the distal horizontal petrous right ICA (series 7, image 211 and series 9, image 59). Left MCA M2 inferior division short segment distal occlusion/near occlusion at the level of the dorsal insula (series 12, image 23), with patent downstream vessel. No additional large vessel occlusion, aneurysm, or high-grade stenosis. Posterior circulation: Mild left P1 stenosis. No significant proximal stenosis, proximal occlusion, aneurysm, or vascular malformation. Segments of stenosis in the distal PCA circulation bilaterally. Venous sinuses: As permitted by contrast timing, patent. Anatomic variants: Anterior communicating artery and left posterior communicating arteries are present. No right posterior communicating artery identified, likely hypoplastic or absent. Delayed phase: No abnormal intracranial enhancement. Review of the MIP images confirms the above findings CT Brain Perfusion Findings: CBF (<30%) Volume: 31mL Perfusion (Tmax>6.0s) volume: 13 ccmL Mismatch Volume: 13 ccmL Infarction Location:Left parietal lobe oligemia. No infarct by standard perfusion criteria. IMPRESSION: 1. Left parietal lobe oligemia with ischemic penumbra of 13 cc. No acute infarct by standard perfusion criteria. 2. Left M2 inferior division distal short segment of occlusion/near occlusion with patent downstream vessel. 3. 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 4. 15 mm nodule in the right lobe of thyroid  gland, thyroid ultrasound is recommended on a nonemergent basis. These results were called by telephone at the time of interpretation on 02/14/2018 at 7:33 pm to Dr. Larae Grooms , who verbally acknowledged these results. Electronically Signed   By: Kristine Garbe M.D.   On: 02/16/2018 19:39   Dg Chest 2 View  Result Date: 02/06/2018 CLINICAL DATA:  Dyspnea. EXAM: CHEST - 2 VIEW COMPARISON:  05/21/2008 FINDINGS: Mild cardiomegaly. Moderate aortic atherosclerosis with uncoiling is noted. No acute pulmonary consolidation or CHF. No effusion or pneumothorax. Osteoarthritis of the [facility] and glenohumeral joints bilaterally. Degenerative change noted dorsal spine without suspicious osseous abnormality. IMPRESSION: Stable cardiomegaly with aortic atherosclerosis. No active pulmonary disease. Electronically Signed   By: Ashley Royalty M.D.   On: 02/19/2018 23:38   Ct Angio Neck W Or Wo Contrast  Result Date: 02/18/2018 CLINICAL DATA:  84 y/o F; slurred speech, extremity weakness, and confusion, symptoms resolved. EXAM: CT ANGIOGRAPHY HEAD AND NECK CT PERFUSION BRAIN TECHNIQUE: Multidetector CT imaging of the head and neck was performed using the standard protocol during bolus administration of intravenous contrast. Multiplanar CT image reconstructions and MIPs were obtained to evaluate the vascular anatomy. Carotid stenosis measurements (when applicable) are obtained utilizing NASCET criteria, using  the distal internal carotid diameter as the denominator. Multiphase CT imaging of the brain was performed following IV bolus contrast injection. Subsequent parametric perfusion maps were calculated using RAPID software. CONTRAST:  145mL ISOVUE-370 IOPAMIDOL (ISOVUE-370) INJECTION 76% COMPARISON:  01/25/2018 CT head.  07/28/2017 carotid duplex. FINDINGS: CTA NECK FINDINGS Aortic arch: Standard branching. Imaged portion shows no evidence of aneurysm or dissection. No significant stenosis of the major arch vessel  origins. Right carotid system: No evidence of dissection, stenosis (50% or greater) or occlusion. Left carotid system: No evidence of dissection, stenosis (50% or greater) or occlusion. Vertebral arteries: Codominant. No evidence of dissection, stenosis (50% or greater) or occlusion. Skeleton: Mild cervical spondylosis. C6-7 grade 1 anterolisthesis with prominent facet arthropathy. No high-grade bony canal stenosis. Other neck: 15 mm nodule in the right lobe of thyroid (series 6, image 74). Upper chest: Negative. Review of the MIP images confirms the above findings CTA HEAD FINDINGS Anterior circulation: 6 mm anteriorly directed aneurysm arising from the distal horizontal petrous right ICA (series 7, image 211 and series 9, image 59). Left MCA M2 inferior division short segment distal occlusion/near occlusion at the level of the dorsal insula (series 12, image 23), with patent downstream vessel. No additional large vessel occlusion, aneurysm, or high-grade stenosis. Posterior circulation: Mild left P1 stenosis. No significant proximal stenosis, proximal occlusion, aneurysm, or vascular malformation. Segments of stenosis in the distal PCA circulation bilaterally. Venous sinuses: As permitted by contrast timing, patent. Anatomic variants: Anterior communicating artery and left posterior communicating arteries are present. No right posterior communicating artery identified, likely hypoplastic or absent. Delayed phase: No abnormal intracranial enhancement. Review of the MIP images confirms the above findings CT Brain Perfusion Findings: CBF (<30%) Volume: 25mL Perfusion (Tmax>6.0s) volume: 13 ccmL Mismatch Volume: 13 ccmL Infarction Location:Left parietal lobe oligemia. No infarct by standard perfusion criteria. IMPRESSION: 1. Left parietal lobe oligemia with ischemic penumbra of 13 cc. No acute infarct by standard perfusion criteria. 2. Left M2 inferior division distal short segment of occlusion/near occlusion with  patent downstream vessel. 3. 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 4. 15 mm nodule in the right lobe of thyroid gland, thyroid ultrasound is recommended on a nonemergent basis. These results were called by telephone at the time of interpretation on 01/25/2018 at 7:33 pm to Dr. Larae Grooms , who verbally acknowledged these results. Electronically Signed   By: Kristine Garbe M.D.   On: 02/17/2018 19:39   Mr Brain Wo Contrast  Result Date: 02/01/2018 CLINICAL DATA:  Acute onset of aphasia and facial droop at 2:30 p.m. Follow-up LEFT M2 occlusion. History of hypertension. EXAM: MRI HEAD WITHOUT CONTRAST TECHNIQUE: Multiplanar, multiecho pulse sequences of the brain and surrounding structures were obtained without intravenous contrast. COMPARISON:  CT HEAD January 31, 2018 FINDINGS: INTRACRANIAL CONTENTS: Patchy reduced diffusion LEFT posterior insula and LEFT parietal cortex with low ADC values. Numerous scattered supra infratentorial chronic microhemorrhages. Ventricles and sulci are normal for patient's age. No midline shift, mass effect or masses. Old small RIGHT cerebellar infarct. Numerous supratentorial subcentimeter white matter flared T2 hyperintensities with cystic component. No abnormal extra-axial fluid collections. VASCULAR: Normal major intracranial vascular flow voids present at skull base. SKULL AND UPPER CERVICAL SPINE: No abnormal sellar expansion. Nonspecific low signal calvarium. Craniocervical junction maintained. SINUSES/ORBITS: The mastoid air-cells and included paranasal sinuses are well-aerated.The included ocular globes and orbital contents are non-suspicious. Status post bilateral ocular lens implants. OTHER: None. IMPRESSION: 1. Multifocal acute small LEFT  MCA territory nonhemorrhagic infarcts. 2. Numerous chronic microhemorrhages suggesting amyloid angiopathy. 3. Mild-to-moderate chronic small vessel ischemic changes.  Electronically Signed   By: Elon Alas M.D.   On: 02/01/2018 00:27   Ct Cerebral Perfusion W Contrast  Result Date: 02/19/2018 CLINICAL DATA:  84 y/o F; slurred speech, extremity weakness, and confusion, symptoms resolved. EXAM: CT ANGIOGRAPHY HEAD AND NECK CT PERFUSION BRAIN TECHNIQUE: Multidetector CT imaging of the head and neck was performed using the standard protocol during bolus administration of intravenous contrast. Multiplanar CT image reconstructions and MIPs were obtained to evaluate the vascular anatomy. Carotid stenosis measurements (when applicable) are obtained utilizing NASCET criteria, using the distal internal carotid diameter as the denominator. Multiphase CT imaging of the brain was performed following IV bolus contrast injection. Subsequent parametric perfusion maps were calculated using RAPID software. CONTRAST:  149mL ISOVUE-370 IOPAMIDOL (ISOVUE-370) INJECTION 76% COMPARISON:  01/25/2018 CT head.  07/28/2017 carotid duplex. FINDINGS: CTA NECK FINDINGS Aortic arch: Standard branching. Imaged portion shows no evidence of aneurysm or dissection. No significant stenosis of the major arch vessel origins. Right carotid system: No evidence of dissection, stenosis (50% or greater) or occlusion. Left carotid system: No evidence of dissection, stenosis (50% or greater) or occlusion. Vertebral arteries: Codominant. No evidence of dissection, stenosis (50% or greater) or occlusion. Skeleton: Mild cervical spondylosis. C6-7 grade 1 anterolisthesis with prominent facet arthropathy. No high-grade bony canal stenosis. Other neck: 15 mm nodule in the right lobe of thyroid (series 6, image 74). Upper chest: Negative. Review of the MIP images confirms the above findings CTA HEAD FINDINGS Anterior circulation: 6 mm anteriorly directed aneurysm arising from the distal horizontal petrous right ICA (series 7, image 211 and series 9, image 59). Left MCA M2 inferior division short segment distal  occlusion/near occlusion at the level of the dorsal insula (series 12, image 23), with patent downstream vessel. No additional large vessel occlusion, aneurysm, or high-grade stenosis. Posterior circulation: Mild left P1 stenosis. No significant proximal stenosis, proximal occlusion, aneurysm, or vascular malformation. Segments of stenosis in the distal PCA circulation bilaterally. Venous sinuses: As permitted by contrast timing, patent. Anatomic variants: Anterior communicating artery and left posterior communicating arteries are present. No right posterior communicating artery identified, likely hypoplastic or absent. Delayed phase: No abnormal intracranial enhancement. Review of the MIP images confirms the above findings CT Brain Perfusion Findings: CBF (<30%) Volume: 73mL Perfusion (Tmax>6.0s) volume: 13 ccmL Mismatch Volume: 13 ccmL Infarction Location:Left parietal lobe oligemia. No infarct by standard perfusion criteria. IMPRESSION: 1. Left parietal lobe oligemia with ischemic penumbra of 13 cc. No acute infarct by standard perfusion criteria. 2. Left M2 inferior division distal short segment of occlusion/near occlusion with patent downstream vessel. 3. 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 4. 15 mm nodule in the right lobe of thyroid gland, thyroid ultrasound is recommended on a nonemergent basis. These results were called by telephone at the time of interpretation on 02/15/2018 at 7:33 pm to Dr. Larae Grooms , who verbally acknowledged these results. Electronically Signed   By: Kristine Garbe M.D.   On: 02/15/2018 19:39   Ct Head Code Stroke Wo Contrast  Result Date: 02/17/2018 CLINICAL DATA:  Code stroke. 84 y/o F; slurred speech, weakness, confusion. EXAM: CT HEAD WITHOUT CONTRAST TECHNIQUE: Contiguous axial images were obtained from the base of the skull through the vertex without intravenous contrast. COMPARISON:  07/27/2017 CT head.  FINDINGS: Brain: No evidence of acute infarction, hemorrhage, hydrocephalus, extra-axial collection or  mass lesion/mass effect. Partially empty sella turcica. New tiny lucency within the left posterior frontal periventricular white matter compatible with chronic microvascular ischemic change. Additional chronic microvascular ischemic changes and parenchymal volume loss of the brain on prior CT of head are stable. Vascular: Calcific atherosclerosis of carotid siphons. No hyperdense vessel identified. Skull: Normal. Negative for fracture or focal lesion. Sinuses/Orbits: No acute finding. Other: Bilateral intra-ocular lens replacement. ASPECTS [facility] Stroke Program Early CT Score) - Ganglionic level infarction (caudate, lentiform nuclei, internal capsule, insula, M1-M3 cortex): 7 - Supraganglionic infarction (M4-M6 cortex): 3 Total score (0-10 with 10 being normal): 10 IMPRESSION: 1. No acute intracranial abnormality identified. 2. ASPECTS is 10. 3. Chronic microvascular ischemic changes and parenchymal volume loss of the brain. These results were called by telephone at the time of interpretation on 01/30/2018 at 5:30 pm to Dr. Larae Grooms , who verbally acknowledged these results. Electronically Signed   By: Kristine Garbe M.D.   On: 01/27/2018 17:32    PHYSICAL EXAM Pleasant frail elderly lady currently not in distress. . Afebrile. Head is nontraumatic. Neck is supple without bruit.    Cardiac exam no murmur or gallop. Lungs are clear to auscultation. Distal pulses are well felt. Neurological Exam ;  Awake  Alert oriented x 3. Normal speech and language. No aphasia or dysarthria. Minimum right lower facial asymmetry when she smiles.Marland Kitchendiminished attention, registration and recall.Eye movements full without nystagmus.fundi were not visualized. Vision acuity and fields appear normal. Hearing is normal. Palatal movements are normal. Face symmetric. Tongue midline. Normal strength, tone, reflexes and  coordination. Normal sensation. Gait deferred.  ASSESSMENT/PLAN Kaitlyn Bauer is a 84 y.o. female with history of HTN and AF not on AC presenting to [facility] with aphasia and facial droop. famiy refused tPA. Improved upon arrival to [facility] so IR not indicated.   Stroke:  Multiple left MCA infarcts embolic secondary to known atrial fibrillation source not on [facility]  Code Stroke CT head No acute stroke. Small vessel disease. Atrophy. ASPECTS 10.     CTA head & neck L M2 occlusion. R ICA 43mm aneurysm. R thyroid lobe 15 mm nodule  CT perfusion : parietal penumbra 13. No acute infarct  MRI  Mult small L MCA infarcts. Mult chronic microhemorrhages suggestion amyloid angiopathy. Small vessel disease.   2D Echo  pending   LDL 98  HgbA1c 5.9  Heparin 5000 units sq tid for VTE prophylaxis  Reports she did NOT take aspirin 325 mg daily prior to admission as reported, now on No antithrombotic. Add eliquis. Pharmacy to dose.  Therapy recommendations:  pending   Disposition:  pending   Ok to be OOB  Transfer to the floor  Anticipate d/c in am  Atrial Fibrillation  Home anticoagulation:  none   CHA2DS2-VASc Score = at least 4, ?2 oral anticoagulation recommended  Age in Years:  ?24   +2    Sex:  Female   +1    Hypertension History:  yes   +1     Diabetes Mellitus:  0  Congestive Heart Failure History:  0  Vascular Disease History:  0     Stroke/TIA/Thromboembolism History:  yes   +2 . Only 1 reported fall when she was in a hurry - does not fall otherwise. Agreeable to be careful and slower in her movements . Add Eliquis (apixaban) daily    Hypertension  Stable . Home meds:  norvasc 10, hydralazine 25 tid . Resume home BP meds . (OK if <  220/120) but gradually normalize in 5-7 days . Long-term BP goal normotensive  Hyperlipidemia  Home meds:  No statin  LDL 98, goal < 70  Add lipitor 10 (dtr states she likely will not take, does not like to take meds if they make her feel  bad)  Continue statin at discharge  Other Active Problems  R thyroid nodule 15 mm, needs OP [place]  Hospital day # [place], MSN, APRN, ANVP-BC, AGPCNP-BC Advanced Practice Stroke Nurse [place] for Schedule & Pager information 02/01/2018 9:03 AM  I have personally examined this patient, reviewed notes, independently viewed imaging studies, participated in medical decision making and plan of care.ROS completed by me personally and pertinent positives fully documented  I have made any additions or clarifications directly to the above note. Agree with note above. She presented with aphasia due to left MCA branch infarct of embolic etiology from atrial fibrillation and was not on anticoagulation. She seems to have made a miraculous recovery despite refusing iv  TPA and intervention. I had a  long discussion the patient and daughter about risk benefits of anticoagulation and would strongly encourage her to start eliquis long-term. Continue ongoing stroke workup. Transfer to the floor. Mobilize out of bed. Therapy consults.greater than 50% of time during this 35 minute visit was spent on counseling and coordination of care about her embolic stroke and discussed number atrial fibrillation and risk benefit of long-term anticoagulation and answered questions     Antony Contras, MD Medical Director [facility] Pager: [phone] 02/01/2018 1:47 PM  To contact Stroke Continuity provider, please refer to [email or web address] After hours, contact General Neurology

## 2018-02-02 ENCOUNTER — Inpatient Hospital Stay (HOSPITAL_COMMUNITY): Payer: Medicare HMO

## 2018-02-02 LAB — CBC
HCT: 43.5 % (ref 36.0–46.0)
HCT: 44.2 % (ref 36.0–46.0)
Hemoglobin: 14 g/dL (ref 12.0–15.0)
Hemoglobin: 14.4 g/dL (ref 12.0–15.0)
MCH: 30.3 pg (ref 26.0–34.0)
MCH: 30.4 pg (ref 26.0–34.0)
MCHC: 32.2 g/dL (ref 30.0–36.0)
MCHC: 32.6 g/dL (ref 30.0–36.0)
MCV: 94.2 fL (ref 78.0–100.0)
MCV: 93.4 fL (ref 78.0–100.0)
PLATELETS: 215 10*3/uL (ref 150–400)
PLATELETS: 242 10*3/uL (ref 150–400)
RBC: 4.62 MIL/uL (ref 3.87–5.11)
RBC: 4.73 MIL/uL (ref 3.87–5.11)
RDW: 14.4 % (ref 11.5–15.5)
RDW: 14.3 % (ref 11.5–15.5)
WBC: 23.5 10*3/uL — ABNORMAL HIGH (ref 4.0–10.5)
WBC: 28.3 10*3/uL — AB (ref 4.0–10.5)

## 2018-02-02 LAB — URINALYSIS, ROUTINE W REFLEX MICROSCOPIC
GLUCOSE, UA: NEGATIVE mg/dL
HGB URINE DIPSTICK: NEGATIVE
KETONES UR: NEGATIVE mg/dL
LEUKOCYTES UA: NEGATIVE
Nitrite: NEGATIVE
PROTEIN: NEGATIVE mg/dL
Specific Gravity, Urine: 1.025 (ref 1.005–1.030)
pH: 5.5 (ref 5.0–8.0)

## 2018-02-02 LAB — BASIC METABOLIC PANEL
Anion gap: 10 (ref 5–15)
Anion gap: 11 (ref 5–15)
BUN: 13 mg/dL (ref 8–23)
BUN: 15 mg/dL (ref 8–23)
CALCIUM: 9.2 mg/dL (ref 8.9–10.3)
CHLORIDE: 105 mmol/L (ref 98–111)
CHLORIDE: 102 mmol/L (ref 98–111)
CO2: 24 mmol/L (ref 22–32)
CO2: 26 mmol/L (ref 22–32)
CREATININE: 1.19 mg/dL — AB (ref 0.44–1.00)
Calcium: 9 mg/dL (ref 8.9–10.3)
Creatinine, Ser: 1.08 mg/dL — ABNORMAL HIGH (ref 0.44–1.00)
GFR calc Af Amer: 44 mL/min — ABNORMAL LOW (ref >60)
GFR calc non Af Amer: 38 mL/min — ABNORMAL LOW (ref >60)
GFR, EST AFRICAN AMERICAN: 50 mL/min — AB (ref >60)
GFR, EST NON AFRICAN AMERICAN: 43 mL/min — AB (ref >60)
Glucose, Bld: 188 mg/dL — ABNORMAL HIGH (ref 70–99)
Glucose, Bld: 140 mg/dL — ABNORMAL HIGH (ref 70–99)
Potassium: 3.7 mmol/L (ref 3.5–5.1)
Potassium: 3.8 mmol/L (ref 3.5–5.1)
SODIUM: 139 mmol/L (ref 135–145)
SODIUM: 139 mmol/L (ref 135–145)

## 2018-02-02 LAB — OCCULT BLOOD GASTRIC / DUODENUM (SPECIMEN CUP)
OCCULT BLOOD, GASTRIC: POSITIVE — AB
pH, Gastric: 4

## 2018-02-02 MED ORDER — ADULT MULTIVITAMIN W/MINERALS CH
1.0000 | ORAL_TABLET | Freq: Every day | ORAL | Status: DC
  Administered 2018-02-02 – 2018-02-10 (×6): 1 via ORAL
  Filled 2018-02-02 (×6): qty 1

## 2018-02-02 MED ORDER — ONDANSETRON HCL 4 MG/2ML IJ SOLN
4.0000 mg | Freq: Four times a day (QID) | INTRAMUSCULAR | Status: DC | PRN
  Administered 2018-02-02 – 2018-02-11 (×10): 4 mg via INTRAVENOUS
  Filled 2018-02-02 (×13): qty 2

## 2018-02-02 NOTE — Progress Notes (Addendum)
STROKE TEAM PROGRESS NOTE  HPI ( Dr Aroor) : Kaitlyn Bauer is an 84 y.o. female with PMH of HTN. Afib not on anticoagulation presents to [facility] as stroke alert after family found to her to sudden onset aphasia and facial droop around 2.30 pm. Patient lives by herself at home and has good functional baseline.   Initially NIHSS of 4 by EDP, however this increased to 7 wth worsening aphasia when evaluated by tele neurologist. TPA was offered, however family declined after discussing risk vs benefit according to teleneurologist's note. CTA showed Left m2 occlusion and CT perfusion show 0 core and 13 cc penumbra.    Dr Earleen Newport, Neurointerventional radiologist at [facility] was called directly by tele neruologist. I was called by EDP and after talking with family over the phone and confirming with family consent for intervention, patient transferred as a code stroke to [facility].   However on arrival patient NIHSS only 2 for facial droop and minimal aphasia (later scored 3 for mild dysarthria). Patient able to read, no neglect or weakness. Discussed case with Dr. Earleen Newport and mechanical thrombectomy was cancelled.   Date last known well: 7.10.19 Time last known well: 2.30 pm tPA Given: no, family declined  NIHSS: 3 Baseline MRS 0   INTERVAL HISTORY Her daughter is not  at the bedside.  Feels pt back to baseline. Lives alone .Therapy evaluate recommends no therapy needs. Patient threw up 3 times. She's feeling tired now.  Vitals:   02/01/18 1642 02/01/18 2012 02/02/18 0015 02/02/18 0400  BP: (!) 154/78 (!) 156/90 (!) 142/73 (!) 153/90  Pulse: 95 92 (!) 105 (!) 108  Resp: 20  (!) 22 (!) 21  Temp: 97.7 F (36.5 C) (!) 97.5 F (36.4 C) 98 F (36.7 C) 97.9 F (36.6 C)  TempSrc: Oral Oral Oral Oral  SpO2: 95% 95% 91% 94%  Weight:      Height:        CBC:  Recent Labs  Lab 01/23/2018 1712 02/01/18 0048 02/02/18 0957  WBC 13.2* 15.6* 23.5*  NEUTROABS 5.4  --    --   HGB 13.5 14.0 14.0  HCT 39.4 42.3 43.5  MCV 93.0 92.4 94.2  PLT 208 232 270    Basic Metabolic Panel:  Recent Labs  Lab 02/01/18 0909 02/02/18 0957  NA 141 139  K 3.6 3.7  CL 105 105  CO2 24 24  GLUCOSE 111* 188*  BUN 5* 13  CREATININE 0.71 1.08*  CALCIUM 9.0 9.0   Lipid Panel:     Component Value Date/Time   CHOL 160 02/01/2018 0243   TRIG 95 02/01/2018 0243   HDL 43 02/01/2018 0243   CHOLHDL 3.7 02/01/2018 0243   VLDL 19 02/01/2018 0243   LDLCALC 98 02/01/2018 0243   HgbA1c:  Lab Results  Component Value Date   HGBA1C 5.9 (H) 02/01/2018   Urine Drug Screen: No results found for: LABOPIA, COCAINSCRNUR, LABBENZ, AMPHETMU, THCU, LABBARB  Alcohol Level No results found for: ETH  IMAGING Ct Angio Head W Or Wo Contrast  Result Date: 02/02/2018 CLINICAL DATA:  84 y/o F; slurred speech, extremity weakness, and confusion, symptoms resolved. EXAM: CT ANGIOGRAPHY HEAD AND NECK CT PERFUSION BRAIN TECHNIQUE: Multidetector CT imaging of the head and neck was performed using the standard protocol during bolus administration of intravenous contrast. Multiplanar CT image reconstructions and MIPs were obtained to evaluate the vascular anatomy. Carotid stenosis measurements (when applicable) are obtained utilizing NASCET criteria, using  the distal internal carotid diameter as the denominator. Multiphase CT imaging of the brain was performed following IV bolus contrast injection. Subsequent parametric perfusion maps were calculated using RAPID software. CONTRAST:  168mL ISOVUE-370 IOPAMIDOL (ISOVUE-370) INJECTION 76% COMPARISON:  01/28/2018 CT head.  07/28/2017 carotid duplex. FINDINGS: CTA NECK FINDINGS Aortic arch: Standard branching. Imaged portion shows no evidence of aneurysm or dissection. No significant stenosis of the major arch vessel origins. Right carotid system: No evidence of dissection, stenosis (50% or greater) or occlusion. Left carotid system: No evidence of  dissection, stenosis (50% or greater) or occlusion. Vertebral arteries: Codominant. No evidence of dissection, stenosis (50% or greater) or occlusion. Skeleton: Mild cervical spondylosis. C6-7 grade 1 anterolisthesis with prominent facet arthropathy. No high-grade bony canal stenosis. Other neck: 15 mm nodule in the right lobe of thyroid (series 6, image 74). Upper chest: Negative. Review of the MIP images confirms the above findings CTA HEAD FINDINGS Anterior circulation: 6 mm anteriorly directed aneurysm arising from the distal horizontal petrous right ICA (series 7, image 211 and series 9, image 59). Left MCA M2 inferior division short segment distal occlusion/near occlusion at the level of the dorsal insula (series 12, image 23), with patent downstream vessel. No additional large vessel occlusion, aneurysm, or high-grade stenosis. Posterior circulation: Mild left P1 stenosis. No significant proximal stenosis, proximal occlusion, aneurysm, or vascular malformation. Segments of stenosis in the distal PCA circulation bilaterally. Venous sinuses: As permitted by contrast timing, patent. Anatomic variants: Anterior communicating artery and left posterior communicating arteries are present. No right posterior communicating artery identified, likely hypoplastic or absent. Delayed phase: No abnormal intracranial enhancement. Review of the MIP images confirms the above findings CT Brain Perfusion Findings: CBF (<30%) Volume: 4mL Perfusion (Tmax>6.0s) volume: 13 ccmL Mismatch Volume: 13 ccmL Infarction Location:Left parietal lobe oligemia. No infarct by standard perfusion criteria. IMPRESSION: 1. Left parietal lobe oligemia with ischemic penumbra of 13 cc. No acute infarct by standard perfusion criteria. 2. Left M2 inferior division distal short segment of occlusion/near occlusion with patent downstream vessel. 3. 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 4. 15 mm nodule in the  right lobe of thyroid gland, thyroid ultrasound is recommended on a nonemergent basis. These results were called by telephone at the time of interpretation on 02/21/2018 at 7:33 pm to Dr. Larae Grooms , who verbally acknowledged these results. Electronically Signed   By: Kristine Garbe M.D.   On: 02/06/2018 19:39   Dg Chest 2 View  Result Date: 02/04/2018 CLINICAL DATA:  Dyspnea. EXAM: CHEST - 2 VIEW COMPARISON:  05/21/2008 FINDINGS: Mild cardiomegaly. Moderate aortic atherosclerosis with uncoiling is noted. No acute pulmonary consolidation or CHF. No effusion or pneumothorax. Osteoarthritis of the [facility] and glenohumeral joints bilaterally. Degenerative change noted dorsal spine without suspicious osseous abnormality. IMPRESSION: Stable cardiomegaly with aortic atherosclerosis. No active pulmonary disease. Electronically Signed   By: Ashley Royalty M.D.   On: 02/18/2018 23:38   Ct Angio Neck W Or Wo Contrast  Result Date: 02/07/2018 CLINICAL DATA:  84 y/o F; slurred speech, extremity weakness, and confusion, symptoms resolved. EXAM: CT ANGIOGRAPHY HEAD AND NECK CT PERFUSION BRAIN TECHNIQUE: Multidetector CT imaging of the head and neck was performed using the standard protocol during bolus administration of intravenous contrast. Multiplanar CT image reconstructions and MIPs were obtained to evaluate the vascular anatomy. Carotid stenosis measurements (when applicable) are obtained utilizing NASCET criteria, using the distal internal carotid diameter as the denominator. Multiphase CT imaging of  the brain was performed following IV bolus contrast injection. Subsequent parametric perfusion maps were calculated using RAPID software. CONTRAST:  137mL ISOVUE-370 IOPAMIDOL (ISOVUE-370) INJECTION 76% COMPARISON:  01/27/2018 CT head.  07/28/2017 carotid duplex. FINDINGS: CTA NECK FINDINGS Aortic arch: Standard branching. Imaged portion shows no evidence of aneurysm or dissection. No significant stenosis of the  major arch vessel origins. Right carotid system: No evidence of dissection, stenosis (50% or greater) or occlusion. Left carotid system: No evidence of dissection, stenosis (50% or greater) or occlusion. Vertebral arteries: Codominant. No evidence of dissection, stenosis (50% or greater) or occlusion. Skeleton: Mild cervical spondylosis. C6-7 grade 1 anterolisthesis with prominent facet arthropathy. No high-grade bony canal stenosis. Other neck: 15 mm nodule in the right lobe of thyroid (series 6, image 74). Upper chest: Negative. Review of the MIP images confirms the above findings CTA HEAD FINDINGS Anterior circulation: 6 mm anteriorly directed aneurysm arising from the distal horizontal petrous right ICA (series 7, image 211 and series 9, image 59). Left MCA M2 inferior division short segment distal occlusion/near occlusion at the level of the dorsal insula (series 12, image 23), with patent downstream vessel. No additional large vessel occlusion, aneurysm, or high-grade stenosis. Posterior circulation: Mild left P1 stenosis. No significant proximal stenosis, proximal occlusion, aneurysm, or vascular malformation. Segments of stenosis in the distal PCA circulation bilaterally. Venous sinuses: As permitted by contrast timing, patent. Anatomic variants: Anterior communicating artery and left posterior communicating arteries are present. No right posterior communicating artery identified, likely hypoplastic or absent. Delayed phase: No abnormal intracranial enhancement. Review of the MIP images confirms the above findings CT Brain Perfusion Findings: CBF (<30%) Volume: 11mL Perfusion (Tmax>6.0s) volume: 13 ccmL Mismatch Volume: 13 ccmL Infarction Location:Left parietal lobe oligemia. No infarct by standard perfusion criteria. IMPRESSION: 1. Left parietal lobe oligemia with ischemic penumbra of 13 cc. No acute infarct by standard perfusion criteria. 2. Left M2 inferior division distal short segment of occlusion/near  occlusion with patent downstream vessel. 3. 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 4. 15 mm nodule in the right lobe of thyroid gland, thyroid ultrasound is recommended on a nonemergent basis. These results were called by telephone at the time of interpretation on 02/18/2018 at 7:33 pm to Dr. Larae Grooms , who verbally acknowledged these results. Electronically Signed   By: Kristine Garbe M.D.   On: 01/28/2018 19:39   Mr Brain Wo Contrast  Result Date: 02/01/2018 CLINICAL DATA:  Acute onset of aphasia and facial droop at 2:30 p.m. Follow-up LEFT M2 occlusion. History of hypertension. EXAM: MRI HEAD WITHOUT CONTRAST TECHNIQUE: Multiplanar, multiecho pulse sequences of the brain and surrounding structures were obtained without intravenous contrast. COMPARISON:  CT HEAD January 31, 2018 FINDINGS: INTRACRANIAL CONTENTS: Patchy reduced diffusion LEFT posterior insula and LEFT parietal cortex with low ADC values. Numerous scattered supra infratentorial chronic microhemorrhages. Ventricles and sulci are normal for patient's age. No midline shift, mass effect or masses. Old small RIGHT cerebellar infarct. Numerous supratentorial subcentimeter white matter flared T2 hyperintensities with cystic component. No abnormal extra-axial fluid collections. VASCULAR: Normal major intracranial vascular flow voids present at skull base. SKULL AND UPPER CERVICAL SPINE: No abnormal sellar expansion. Nonspecific low signal calvarium. Craniocervical junction maintained. SINUSES/ORBITS: The mastoid air-cells and included paranasal sinuses are well-aerated.The included ocular globes and orbital contents are non-suspicious. Status post bilateral ocular lens implants. OTHER: None. IMPRESSION: 1. Multifocal acute small LEFT MCA territory nonhemorrhagic infarcts. 2. Numerous chronic microhemorrhages suggesting amyloid angiopathy. 3.  Mild-to-moderate chronic small vessel ischemic  changes. Electronically Signed   By: Elon Alas M.D.   On: 02/01/2018 00:27   Ct Cerebral Perfusion W Contrast  Result Date: 01/27/2018 CLINICAL DATA:  84 y/o F; slurred speech, extremity weakness, and confusion, symptoms resolved. EXAM: CT ANGIOGRAPHY HEAD AND NECK CT PERFUSION BRAIN TECHNIQUE: Multidetector CT imaging of the head and neck was performed using the standard protocol during bolus administration of intravenous contrast. Multiplanar CT image reconstructions and MIPs were obtained to evaluate the vascular anatomy. Carotid stenosis measurements (when applicable) are obtained utilizing NASCET criteria, using the distal internal carotid diameter as the denominator. Multiphase CT imaging of the brain was performed following IV bolus contrast injection. Subsequent parametric perfusion maps were calculated using RAPID software. CONTRAST:  182mL ISOVUE-370 IOPAMIDOL (ISOVUE-370) INJECTION 76% COMPARISON:  02/01/2018 CT head.  07/28/2017 carotid duplex. FINDINGS: CTA NECK FINDINGS Aortic arch: Standard branching. Imaged portion shows no evidence of aneurysm or dissection. No significant stenosis of the major arch vessel origins. Right carotid system: No evidence of dissection, stenosis (50% or greater) or occlusion. Left carotid system: No evidence of dissection, stenosis (50% or greater) or occlusion. Vertebral arteries: Codominant. No evidence of dissection, stenosis (50% or greater) or occlusion. Skeleton: Mild cervical spondylosis. C6-7 grade 1 anterolisthesis with prominent facet arthropathy. No high-grade bony canal stenosis. Other neck: 15 mm nodule in the right lobe of thyroid (series 6, image 74). Upper chest: Negative. Review of the MIP images confirms the above findings CTA HEAD FINDINGS Anterior circulation: 6 mm anteriorly directed aneurysm arising from the distal horizontal petrous right ICA (series 7, image 211 and series 9, image 59). Left MCA M2 inferior division short segment distal  occlusion/near occlusion at the level of the dorsal insula (series 12, image 23), with patent downstream vessel. No additional large vessel occlusion, aneurysm, or high-grade stenosis. Posterior circulation: Mild left P1 stenosis. No significant proximal stenosis, proximal occlusion, aneurysm, or vascular malformation. Segments of stenosis in the distal PCA circulation bilaterally. Venous sinuses: As permitted by contrast timing, patent. Anatomic variants: Anterior communicating artery and left posterior communicating arteries are present. No right posterior communicating artery identified, likely hypoplastic or absent. Delayed phase: No abnormal intracranial enhancement. Review of the MIP images confirms the above findings CT Brain Perfusion Findings: CBF (<30%) Volume: 65mL Perfusion (Tmax>6.0s) volume: 13 ccmL Mismatch Volume: 13 ccmL Infarction Location:Left parietal lobe oligemia. No infarct by standard perfusion criteria. IMPRESSION: 1. Left parietal lobe oligemia with ischemic penumbra of 13 cc. No acute infarct by standard perfusion criteria. 2. Left M2 inferior division distal short segment of occlusion/near occlusion with patent downstream vessel. 3. 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 4. 15 mm nodule in the right lobe of thyroid gland, thyroid ultrasound is recommended on a nonemergent basis. These results were called by telephone at the time of interpretation on 01/23/2018 at 7:33 pm to Dr. Larae Grooms , who verbally acknowledged these results. Electronically Signed   By: Kristine Garbe M.D.   On: 02/04/2018 19:39   Ct Head Code Stroke Wo Contrast  Result Date: 01/24/2018 CLINICAL DATA:  Code stroke. 84 y/o F; slurred speech, weakness, confusion. EXAM: CT HEAD WITHOUT CONTRAST TECHNIQUE: Contiguous axial images were obtained from the base of the skull through the vertex without intravenous contrast. COMPARISON:  07/27/2017 CT head.  FINDINGS: Brain: No evidence of acute infarction, hemorrhage, hydrocephalus, extra-axial collection or mass lesion/mass effect. Partially empty sella turcica. New tiny lucency within the  left posterior frontal periventricular white matter compatible with chronic microvascular ischemic change. Additional chronic microvascular ischemic changes and parenchymal volume loss of the brain on prior CT of head are stable. Vascular: Calcific atherosclerosis of carotid siphons. No hyperdense vessel identified. Skull: Normal. Negative for fracture or focal lesion. Sinuses/Orbits: No acute finding. Other: Bilateral intra-ocular lens replacement. ASPECTS [facility] Stroke Program Early CT Score) - Ganglionic level infarction (caudate, lentiform nuclei, internal capsule, insula, M1-M3 cortex): 7 - Supraganglionic infarction (M4-M6 cortex): 3 Total score (0-10 with 10 being normal): 10 IMPRESSION: 1. No acute intracranial abnormality identified. 2. ASPECTS is 10. 3. Chronic microvascular ischemic changes and parenchymal volume loss of the brain. These results were called by telephone at the time of interpretation on 01/30/2018 at 5:30 pm to Dr. Larae Grooms , who verbally acknowledged these results. Electronically Signed   By: Kristine Garbe M.D.   On: 02/13/2018 17:32    PHYSICAL EXAM Pleasant frail elderly lady currently not in distress. . Afebrile. Head is nontraumatic. Neck is supple without bruit.    Cardiac exam no murmur or gallop. Lungs are clear to auscultation. Distal pulses are well felt. Neurological Exam ;  Awake  Alert oriented x 3. Normal speech and language. No aphasia or dysarthria. Minimum right lower facial asymmetry when she smiles.Marland Kitchendiminished attention, registration and recall.Eye movements full without nystagmus.fundi were not visualized. Vision acuity and fields appear normal. Hearing is diminished bilaterallyl. Palatal movements are normal. Face symmetric. Tongue midline. Normal strength,  tone, reflexes and coordination. Normal sensation. Gait deferred.  ASSESSMENT/PLAN Kaitlyn Bauer is a 84 y.o. female with history of HTN and AF not on AC presenting to [facility] with aphasia and facial droop. famiy refused tPA. Improved upon arrival to [facility] so IR not indicated.   Stroke:  Multiple left MCA infarcts embolic secondary to known atrial fibrillation source not on [facility]  Code Stroke CT head No acute stroke. Small vessel disease. Atrophy. ASPECTS 10.     CTA head & neck L M2 occlusion. R ICA 57mm aneurysm. R thyroid lobe 15 mm nodule  CT perfusion : parietal penumbra 13. No acute infarct  MRI  Mult small L MCA infarcts. Mult chronic microhemorrhages suggestion amyloid angiopathy. Small vessel disease.   2D Echo  pending   LDL 98  HgbA1c 5.9  Heparin 5000 units sq tid for VTE prophylaxis  Reports she did NOT take aspirin 325 mg daily prior to admission as reported,  . Added eliquis. Pharmacy to dose.  Therapy recommendations:  pending   Disposition:  pending   Ok to be OOB  Transfer to the floor  Anticipate d/c in am  Atrial Fibrillation  Home anticoagulation:  none   CHA2DS2-VASc Score = at least 4, ?2 oral anticoagulation recommended  Age in Years:  ?49   +2    Sex:  Female   +1    Hypertension History:  yes   +1     Diabetes Mellitus:  0  Congestive Heart Failure History:  0  Vascular Disease History:  0     Stroke/TIA/Thromboembolism History:  yes   +2 . Only 1 reported fall when she was in a hurry - does not fall otherwise. Agreeable to be careful and slower in her movements . Add Eliquis (apixaban) daily    Hypertension  Stable . Home meds:  norvasc 10, hydralazine 25 tid . Resume home BP meds . (OK if < 220/120) but gradually normalize in 5-7 days . Long-term BP goal normotensive  Hyperlipidemia  Home meds:  No statin  LDL 98, goal < 70  Add lipitor 10 (dtr states she likely will not take, does not like to take meds if they make her feel  bad)  Continue statin at discharge  Other Active Problems  R thyroid nodule 15 mm, needs OP [place]  Hospital day # 2    She presented with aphasia due to left MCA branch infarct of embolic etiology from atrial fibrillation and was not on anticoagulation. She seems to have made a miraculous recovery despite refusing iv  TPA and intervention. I had a  long discussion the patient and daughter about risk benefits of anticoagulation and would strongly encourage her to start eliquis long-term.    Likely discharge home later today if family is agreeable     Antony Contras, MD Medical Director [place] Pager: [phone] 02/02/2018 1:32 PM   ADDENDUM : I was called this afternoon by the patient's granddaughter saying that she's noticed neurological change. Patient was less responsive and not speaking as well. When I examined her she appeared to be aphasic and confused and could not carry out a normal conversation but she had done earlier during the day. Repeat CT scan of the head was obtained stat which did not show any acute hemorrhage or new finding. Lab work was sent and CBC was found to be elevated at 28,000 which was higher than yesterday when it was 23,000. Patient had a recent urinary tract infection for which she was being treated on Macrobid. She was currently also on doxycycline also during this hospitalization. She is penicillin allergic. Chest x-ray on admission did not show any signs of pneumonia Urine analysis and microscopy and cultures were sent. EEG was ordered to rule out seizure activity. Etiology for patient's fluctuating mental status is unclear but may be due to combination of mild underlying dementia, damage from the stroke and ongoing urinary tract infection. Motor and granddaughter notified. Antony Contras, MD     To contact Stroke Continuity provider, please refer to [email or web address] After hours, contact General Neurology

## 2018-02-02 NOTE — Procedures (Signed)
History: 84 year old female with left MCA territory infarcts  Sedation: Not performed  Technique: This is a 21 channel routine scalp EEG performed at the bedside with bipolar and monopolar montages arranged in accordance to the international 10/20 system of electrode placement. One channel was dedicated to EKG recording.    Background: There is a posterior dominant rhythm of 8 Hz which is well-formed.  In addition, there is focal left-sided delta and theta activity which is maximal in the left anterior temporal region (F7, T7).  Sleep is recorded with symmetric structures  Photic stimulation: Physiologic driving is not performed  EEG Abnormalities: 1) left temporal slow activity  Clinical Interpretation: This EEG is consistent with a focal area of cerebral dysfunction centered in the left anterior temporal region. There was no seizure or seizure predisposition recorded on this study. Please note that a normal EEG does not preclude the possibility of epilepsy.   Roland Rack, MD [facility] [phone]  If 7pm- 7am, please page neurology on call as listed in [place].

## 2018-02-02 NOTE — Progress Notes (Signed)
Patient had dark brown emesis at the said time. Patient verbalized  "I feel better after throwing up". Will keep monitoring.

## 2018-02-02 NOTE — Evaluation (Signed)
Physical Therapy Evaluation Patient Details Name: Kaitlyn Bauer MRN: [id number] DOB: [birth year] Today's Date: 02/02/2018   History of Present Illness  Kaitlyn Bauer is a 84 y.o. female with history of HTN and AF not on AC presenting to [facility] with aphasia and facial droop. famiy refused tPA. Improved upon arrival to [facility] so IR not indicated. Multiple left MCA infarcts embolic secondary to known atrial fibrillation     Clinical Impression  Kaitlyn Bauer is a pleasant 84 y/o female admitted with the above listed diagnosis. Patient reporting that prior to admission she lived alone and was independent with mobility, ADLs, and most household tasks. Patient today requiring Min guard to supervision for transfers and mobility for general safety. Able to ambulate in hallways and up/dopwn 2 steps with Min guard - no LOB or overt instability. PT currently recommending HHPT to ensure safe functional mobility within the home. PT to continue to follow acutely.      Follow Up Recommendations Home health PT;Supervision - Intermittent    Equipment Recommendations  None recommended by PT    Recommendations for Other Services       Precautions / Restrictions Precautions Precautions: Fall Restrictions Weight Bearing Restrictions: No      Mobility  Bed Mobility Overal bed mobility: Needs Assistance Bed Mobility: Sit to Supine       Sit to supine: Supervision   General bed mobility comments: up in recliner upon PT arrival  Transfers Overall transfer level: Needs assistance Equipment used: None Transfers: Sit to/from Stand Sit to Stand: Min guard;Supervision         General transfer comment: for safety and immediate standing balance  Ambulation/Gait Ambulation/Gait assistance: Min guard Gait Distance (Feet): 100 Feet Assistive device: Rolling walker (2 wheeled) Gait Pattern/deviations: Step-through pattern;Decreased stride length;Trunk flexed Gait velocity: decreased   General Gait  Details: very slow pace; verbal cueing for safety awareness   Stairs Stairs: Yes Stairs assistance: Min guard Stair Management: Two rails;Step to pattern Number of Stairs: 2    Wheelchair Mobility    Modified Rankin (Stroke Patients Only)       Balance Overall balance assessment: Needs assistance Sitting-balance support: No upper extremity supported;Feet supported Sitting balance-Leahy Scale: Good     Standing balance support: Bilateral upper extremity supported;During functional activity Standing balance-Leahy Scale: Fair                               Pertinent Vitals/Pain Pain Assessment: No/denies pain    Home Living Family/patient expects to be discharged to:: Private residence Living Arrangements: Alone Available Help at Discharge: Family Type of Home: House Home Access: Stairs to enter Entrance Stairs-Rails: Psychiatric nurse of Steps: 6 Home Layout: Two level;Able to live on main level with bedroom/bathroom;Laundry or work area in [place]: Environmental consultant - 4 wheels;Cane - single point;Crutches      Prior Function Level of Independence: Independent         Comments: Pt is independent with ADLs and mostly independent with IADLs. Intermittent use of assistive device for ambulation. Has had one fall in last 12 months. Pt reports she currently does not drive and doesn't think she plans on going back to driving     Hand Dominance   Dominant Hand: Right    Extremity/Trunk Assessment   Upper Extremity Assessment Upper Extremity Assessment: Defer to OT evaluation    Lower Extremity Assessment Lower Extremity Assessment: Generalized weakness  Cervical / Trunk Assessment Cervical / Trunk Assessment: Normal  Communication   Communication: HOH  Cognition Arousal/Alertness: Awake/alert Behavior During Therapy: WFL for tasks assessed/performed Overall Cognitive Status: Within Functional Limits for tasks assessed                                         General Comments      Exercises     Assessment/Plan    PT Assessment Patient needs continued PT services  PT Problem List Decreased strength;Decreased activity tolerance;Decreased mobility;Decreased balance;Decreased knowledge of use of DME;Decreased safety awareness;Decreased knowledge of precautions       PT Treatment Interventions DME instruction;Gait training;Stair training;Functional mobility training;Therapeutic activities;Therapeutic exercise;Balance training;Neuromuscular re-education;Patient/family education    PT Goals (Current goals can be found in the Care Plan section)  Acute Rehab PT Goals Patient Stated Goal: to get over nausea PT Goal Formulation: With patient Time For Goal Achievement: 02/16/18 Potential to Achieve Goals: Good    Frequency Min 4X/week   Barriers to discharge        Co-evaluation               AM-PAC PT "6 Clicks" Daily Activity  Outcome Measure Difficulty turning over in bed (including adjusting bedclothes, sheets and blankets)?: A Little Difficulty moving from lying on back to sitting on the side of the bed? : A Little Difficulty sitting down on and standing up from a chair with arms (e.g., wheelchair, bedside commode, etc,.)?: Unable Help needed moving to and from a bed to chair (including a wheelchair)?: A Little Help needed walking in hospital room?: A Little Help needed climbing 3-5 steps with a railing? : A Little 6 Click Score: 16    End of Session Equipment Utilized During Treatment: Gait belt Activity Tolerance: Patient tolerated treatment well;Patient limited by fatigue Patient left: in bed;with call bell/phone within reach;with bed alarm set;with family/visitor present Nurse Communication: Mobility status PT Visit Diagnosis: Unsteadiness on feet (R26.81);Other abnormalities of gait and mobility (R26.89);Muscle weakness (generalized) (M62.81);History of falling  (Z91.81)    Time: 5449-2010 PT Time Calculation (min) (ACUTE ONLY): 19 min   Charges:   PT Evaluation $PT Eval Moderate Complexity: 1 Mod     PT G Codes:        Lanney Gins, PT, DPT 02/02/18 10:00 AM Pager: [phone]

## 2018-02-02 NOTE — Progress Notes (Signed)
CM did benefits check for Eliquis that revealed a co pay of $50.63. Family feels this should be ok. CM provided 30 day free card to patients granddaughter. CM following for further d/c needs.

## 2018-02-02 NOTE — Evaluation (Signed)
Occupational Therapy Evaluation and Discharge Patient Details Name: Kaitlyn Bauer MRN: [id number] DOB: [birth year] Today's Date: 02/02/2018    History of Present Illness [name] is a 84 y.o. female with history of HTN and AF not on AC presenting to [facility] with aphasia and facial droop. famiy refused tPA. Improved upon arrival to [facility] so IR not indicated. Multiple left MCA infarcts embolic secondary to known atrial fibrillation    Clinical Impression   This 84 yo female admitted with above presents to acute OT at a min guard A/S level for basic ADLs in this environment. One of her dtrs was in room and I discussed with her the recommendation that someone be with pt all the time for a few days once she is discharged to make sure they feel she is back to her baseline--dtr verbalized understanding. We will D/C from acute OT.    Follow Up Recommendations  No OT follow up;Supervision/Assistance - 24 hour    Equipment Recommendations  None recommended by OT       Precautions / Restrictions Precautions Precautions: Fall Restrictions Weight Bearing Restrictions: No      Mobility Bed Mobility Overal bed mobility: Independent                Transfers Overall transfer level: Needs assistance Equipment used: None Transfers: Sit to/from Stand Sit to Stand: Supervision         General transfer comment: ambulation min guard A    Balance Overall balance assessment: Mild deficits observed, not formally tested                                         ADL either performed or assessed with clinical judgement   ADL                                         General ADL Comments: Overall at a min guard A/S level for basic ADLs     Vision Patient Visual Report: No change from baseline              Pertinent Vitals/Pain Pain Assessment: No/denies pain     Hand Dominance Right   Extremity/Trunk Assessment Upper Extremity  Assessment Upper Extremity Assessment: Overall WFL for tasks assessed   Lower Extremity Assessment Lower Extremity Assessment: Overall WFL for tasks assessed       Communication Communication Communication: HOH   Cognition Arousal/Alertness: Awake/alert Behavior During Therapy: WFL for tasks assessed/performed Overall Cognitive Status: Within Functional Limits for tasks assessed                                                Home Living Family/patient expects to be discharged to:: Private residence Living Arrangements: Alone Available Help at Discharge: Family Type of Home: House Home Access: Stairs to enter [employer] of Steps: 6 Entrance Stairs-Rails: Right;Left Home Layout: Two level;Able to live on main level with bedroom/bathroom;Laundry or work area in Building surveyor of Steps: 13 Alternate Level Stairs-Rails: Can reach both Bathroom Shower/Tub: Teacher, early years/pre: Handicapped height Bathroom Accessibility: Yes   Home Equipment: Environmental consultant - 4 wheels;Cane - single  point;Crutches          Prior Functioning/Environment Level of Independence: Independent        Comments: Pt is independent with ADLs and mostly independent with IADLs. No assistive device for ambulation. Has had one fall in last 12 months. Pt reports she currently does not drive and doesn't think she plans on going back to driving        OT Problem List: Impaired balance (sitting and/or standing)         OT Goals(Current goals can be found in the care plan section) Acute Rehab OT Goals Patient Stated Goal: to get over nausea  OT Frequency:                AM-PAC PT "6 Clicks" Daily Activity     Outcome Measure Help from another person eating meals?: None Help from another person taking care of personal grooming?: A Little Help from another person toileting, which includes using toliet, bedpan, or urinal?: A Little Help  from another person bathing (including washing, rinsing, drying)?: A Little Help from another person to put on and taking off regular upper body clothing?: A Little Help from another person to put on and taking off regular lower body clothing?: A Little 6 Click Score: 19   End of Session Equipment Utilized During Treatment: (none)  Activity Tolerance: Patient tolerated treatment well Patient left: in chair;with call bell/phone within reach;with chair alarm set  OT Visit Diagnosis: Unsteadiness on feet (R26.81)                Time: 8675-4492 OT Time Calculation (min): 29 min Charges:  OT General Charges $OT Visit: 1 Visit OT Evaluation $OT Eval Moderate Complexity: 1 Mod OT Treatments $Self Care/Home Management : 8-22 mins [place], OTR/L [phone] 02/02/2018

## 2018-02-02 NOTE — Progress Notes (Signed)
EEG complete - results pending 

## 2018-02-02 NOTE — Progress Notes (Signed)
Pt has  thrown up 3 times; emesis is dark brown/black colors. NP Amparo Bristol notified. Orders for gastric blood occult were put int. RN sent sample down to lab.

## 2018-02-03 ENCOUNTER — Inpatient Hospital Stay (HOSPITAL_COMMUNITY): Payer: Medicare HMO

## 2018-02-03 ENCOUNTER — Encounter (HOSPITAL_COMMUNITY): Payer: Self-pay | Admitting: *Deleted

## 2018-02-03 LAB — URINALYSIS, COMPLETE (UACMP) WITH MICROSCOPIC
BILIRUBIN URINE: NEGATIVE
GLUCOSE, UA: NEGATIVE mg/dL
HGB URINE DIPSTICK: NEGATIVE
Ketones, ur: 5 mg/dL — AB
NITRITE: NEGATIVE
PH: 5 (ref 5.0–8.0)
Protein, ur: NEGATIVE mg/dL
SPECIFIC GRAVITY, URINE: 1.023 (ref 1.005–1.030)

## 2018-02-03 LAB — URINE CULTURE: Culture: NO GROWTH

## 2018-02-03 LAB — GLUCOSE, CAPILLARY
Glucose-Capillary: 165 mg/dL — ABNORMAL HIGH (ref 70–99)
Glucose-Capillary: 118 mg/dL — ABNORMAL HIGH (ref 70–99)

## 2018-02-03 LAB — BASIC METABOLIC PANEL
Anion gap: 8 (ref 5–15)
BUN: 20 mg/dL (ref 8–23)
CALCIUM: 9.1 mg/dL (ref 8.9–10.3)
CO2: 27 mmol/L (ref 22–32)
CREATININE: 1.08 mg/dL — AB (ref 0.44–1.00)
Chloride: 101 mmol/L (ref 98–111)
GFR, EST AFRICAN AMERICAN: 50 mL/min — AB (ref >60)
GFR, EST NON AFRICAN AMERICAN: 43 mL/min — AB (ref >60)
Glucose, Bld: 121 mg/dL — ABNORMAL HIGH (ref 70–99)
POTASSIUM: 3.9 mmol/L (ref 3.5–5.1)
SODIUM: 136 mmol/L (ref 135–145)

## 2018-02-03 LAB — VITAMIN B12: Vitamin B-12: 1209 pg/mL — ABNORMAL HIGH (ref 180–914)

## 2018-02-03 LAB — TSH: TSH: 2.698 u[IU]/mL (ref 0.350–4.500)

## 2018-02-03 LAB — CBC
HCT: 40.5 % (ref 36.0–46.0)
Hemoglobin: 13.1 g/dL (ref 12.0–15.0)
MCH: 30.3 pg (ref 26.0–34.0)
MCHC: 32.3 g/dL (ref 30.0–36.0)
MCV: 93.8 fL (ref 78.0–100.0)
PLATELETS: 194 10*3/uL (ref 150–400)
RBC: 4.32 MIL/uL (ref 3.87–5.11)
RDW: 14.2 % (ref 11.5–15.5)
WBC: 19 10*3/uL — AB (ref 4.0–10.5)

## 2018-02-03 LAB — AMMONIA: Ammonia: 34 umol/L (ref 9–35)

## 2018-02-03 MED ORDER — SULFAMETHOXAZOLE-TRIMETHOPRIM 800-160 MG PO TABS
1.0000 | ORAL_TABLET | Freq: Two times a day (BID) | ORAL | Status: AC
  Administered 2018-02-03 – 2018-02-06 (×4): 1 via ORAL
  Filled 2018-02-03 (×5): qty 1

## 2018-02-03 NOTE — Progress Notes (Signed)
Central telemetry called and reported pt has a prolonged QTC. MD XU notified and ordered an EKG. Will continue to monitor.

## 2018-02-03 NOTE — Progress Notes (Signed)
STROKE TEAM PROGRESS NOTE  INTERVAL HISTORY Her daughter is at the bedside. Pt still mildly lethargic with nausea but no vomiting. Complains of abdominal (stomach) pain. Still has aphasia and disorientation.   Vitals:   02/03/18 0030 02/03/18 0307 02/03/18 0730 02/03/18 1130  BP: (!) 98/50 112/64 124/67 118/64  Pulse: (!) 105 (!) 103 88 (!) 102  Resp: 18 19 18 18   Temp: 98.9 F (37.2 C) 98.3 F (36.8 C) 98 F (36.7 C) 98.7 F (37.1 C)  TempSrc: Oral Oral Oral Oral  SpO2: 95% 95% 95% 97%  Weight:      Height:        CBC:  Recent Labs  Lab 01/28/2018 1712  02/02/18 1420 02/03/18 0655  WBC 13.2*   < > 28.3* 19.0*  NEUTROABS 5.4  --   --   --   HGB 13.5   < > 14.4 13.1  HCT 39.4   < > 44.2 40.5  MCV 93.0   < > 93.4 93.8  PLT 208   < > 242 194   < > = values in this interval not displayed.    Basic Metabolic Panel:  Recent Labs  Lab 02/02/18 1420 02/03/18 0655  NA 139 136  K 3.8 3.9  CL 102 101  CO2 26 27  GLUCOSE 140* 121*  BUN 15 20  CREATININE 1.19* 1.08*  CALCIUM 9.2 9.1   Lipid Panel:     Component Value Date/Time   CHOL 160 02/01/2018 0243   TRIG 95 02/01/2018 0243   HDL 43 02/01/2018 0243   CHOLHDL 3.7 02/01/2018 0243   VLDL 19 02/01/2018 0243   LDLCALC 98 02/01/2018 0243   HgbA1c:  Lab Results  Component Value Date   HGBA1C 5.9 (H) 02/01/2018   Urine Drug Screen: No results found for: LABOPIA, COCAINSCRNUR, LABBENZ, AMPHETMU, THCU, LABBARB  Alcohol Level No results found for: [place] I have personally reviewed the radiological images below and agree with the radiology interpretations.  Ct Head Wo Contrast 02/02/2018 IMPRESSION:  1. Small left parietal lobe cortical infarctions stable in distribution in comparison with prior MRI of the head.  2. No new acute intracranial abnormality.  3. Stable background of chronic microvascular ischemic changes and parenchymal volume loss of the brain.   Dg Chest Port 1 View 02/03/2018 IMPRESSION:   No active disease.   EEG 02/02/2018 Background: There is a posterior dominant rhythm of 8 Hz which is well-formed.  In addition, there is focal left-sided delta and theta activity which is maximal in the left anterior temporal region (F7, T7).  Sleep is recorded with symmetric structures Photic stimulation: Physiologic driving is not performed EEG Abnormalities: 1) left temporal slow activity Clinical Interpretation: This EEG is consistent with a focal area of cerebral dysfunction centered in the left anterior temporal region. There was no seizure or seizure predisposition recorded on this study. Please note that a normal EEG does not preclude the possibility of epilepsy.   Transthoracic Echocardiogram  02/01/2018 Study Conclusions - Left ventricle: The cavity size was normal. Wall thickness was   increased in a pattern of mild LVH. Systolic function was normal.   The estimated ejection fraction was in the range of 60% to 65%. - Aortic valve: There was mild regurgitation. Valve area (VTI):   2.66 cm^2. Valve area (Vmax): 2.98 cm^2. Valve area (Vmean): 2.7   cm^2. Impressions: - Poor acoustic windows limit study.  Ct Angio Head W Or Wo Contrast 01/28/2018 CLINICAL DATA:  84 y/o F; slurred speech, extremity weakness, and confusion, symptoms resolved. EXAM: CT ANGIOGRAPHY HEAD AND NECK CT PERFUSION BRAIN TECHNIQUE: Multidetector CT imaging of the head and neck was performed using the standard protocol during bolus administration of intravenous contrast. Multiplanar CT image reconstructions and MIPs were obtained to evaluate the vascular anatomy. Carotid stenosis measurements (when applicable) are obtained utilizing NASCET criteria, using the distal internal carotid diameter as the denominator. Multiphase CT imaging of the brain was performed following IV bolus contrast injection. Subsequent parametric perfusion maps were calculated using RAPID software. CONTRAST:  15mL ISOVUE-370 IOPAMIDOL  (ISOVUE-370) INJECTION 76% COMPARISON:  01/24/2018 CT head.  07/28/2017 carotid duplex. FINDINGS: CTA NECK FINDINGS Aortic arch: Standard branching. Imaged portion shows no evidence of aneurysm or dissection. No significant stenosis of the major arch vessel origins. Right carotid system: No evidence of dissection, stenosis (50% or greater) or occlusion. Left carotid system: No evidence of dissection, stenosis (50% or greater) or occlusion. Vertebral arteries: Codominant. No evidence of dissection, stenosis (50% or greater) or occlusion. Skeleton: Mild cervical spondylosis. C6-7 grade 1 anterolisthesis with prominent facet arthropathy. No high-grade bony canal stenosis. Other neck: 15 mm nodule in the right lobe of thyroid (series 6, image 74). Upper chest: Negative. Review of the MIP images confirms the above findings CTA HEAD FINDINGS Anterior circulation: 6 mm anteriorly directed aneurysm arising from the distal horizontal petrous right ICA (series 7, image 211 and series 9, image 59). Left MCA M2 inferior division short segment distal occlusion/near occlusion at the level of the dorsal insula (series 12, image 23), with patent downstream vessel. No additional large vessel occlusion, aneurysm, or high-grade stenosis. Posterior circulation: Mild left P1 stenosis. No significant proximal stenosis, proximal occlusion, aneurysm, or vascular malformation. Segments of stenosis in the distal PCA circulation bilaterally. Venous sinuses: As permitted by contrast timing, patent. Anatomic variants: Anterior communicating artery and left posterior communicating arteries are present. No right posterior communicating artery identified, likely hypoplastic or absent. Delayed phase: No abnormal intracranial enhancement. Review of the MIP images confirms the above findings CT Brain Perfusion Findings: CBF (<30%) Volume: 76mL Perfusion (Tmax>6.0s) volume: 13 ccmL Mismatch Volume: 13 ccmL Infarction Location:Left parietal lobe  oligemia. No infarct by standard perfusion criteria. IMPRESSION: 1. Left parietal lobe oligemia with ischemic penumbra of 13 cc. No acute infarct by standard perfusion criteria. 2. Left M2 inferior division distal short segment of occlusion/near occlusion with patent downstream vessel. 3. 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 4. 15 mm nodule in the right lobe of thyroid gland, thyroid ultrasound is recommended on a nonemergent basis. These results were called by telephone at the time of interpretation on 02/10/2018 at 7:33 pm to Dr. Larae Grooms , who verbally acknowledged these results. Electronically Signed   By: Kristine Garbe M.D.   On: 01/28/2018 19:39   Ct Angio Neck W Or Wo Contrast 01/22/2018 CLINICAL DATA:  84 y/o F; slurred speech, extremity weakness, and confusion, symptoms resolved. EXAM: CT ANGIOGRAPHY HEAD AND NECK CT PERFUSION BRAIN TECHNIQUE: Multidetector CT imaging of the head and neck was performed using the standard protocol during bolus administration of intravenous contrast. Multiplanar CT image reconstructions and MIPs were obtained to evaluate the vascular anatomy. Carotid stenosis measurements (when applicable) are obtained utilizing NASCET criteria, using the distal internal carotid diameter as the denominator. Multiphase CT imaging of the brain was performed following IV bolus contrast injection. Subsequent parametric perfusion maps were calculated using RAPID software. CONTRAST:  141mL  ISOVUE-370 IOPAMIDOL (ISOVUE-370) INJECTION 76% COMPARISON:  02/07/2018 CT head.  07/28/2017 carotid duplex. FINDINGS: CTA NECK FINDINGS Aortic arch: Standard branching. Imaged portion shows no evidence of aneurysm or dissection. No significant stenosis of the major arch vessel origins. Right carotid system: No evidence of dissection, stenosis (50% or greater) or occlusion. Left carotid system: No evidence of dissection, stenosis (50% or  greater) or occlusion. Vertebral arteries: Codominant. No evidence of dissection, stenosis (50% or greater) or occlusion. Skeleton: Mild cervical spondylosis. C6-7 grade 1 anterolisthesis with prominent facet arthropathy. No high-grade bony canal stenosis. Other neck: 15 mm nodule in the right lobe of thyroid (series 6, image 74). Upper chest: Negative. Review of the MIP images confirms the above findings CTA HEAD FINDINGS Anterior circulation: 6 mm anteriorly directed aneurysm arising from the distal horizontal petrous right ICA (series 7, image 211 and series 9, image 59). Left MCA M2 inferior division short segment distal occlusion/near occlusion at the level of the dorsal insula (series 12, image 23), with patent downstream vessel. No additional large vessel occlusion, aneurysm, or high-grade stenosis. Posterior circulation: Mild left P1 stenosis. No significant proximal stenosis, proximal occlusion, aneurysm, or vascular malformation. Segments of stenosis in the distal PCA circulation bilaterally. Venous sinuses: As permitted by contrast timing, patent. Anatomic variants: Anterior communicating artery and left posterior communicating arteries are present. No right posterior communicating artery identified, likely hypoplastic or absent. Delayed phase: No abnormal intracranial enhancement. Review of the MIP images confirms the above findings CT Brain Perfusion Findings: CBF (<30%) Volume: 60mL Perfusion (Tmax>6.0s) volume: 13 ccmL Mismatch Volume: 13 ccmL Infarction Location:Left parietal lobe oligemia. No infarct by standard perfusion criteria. IMPRESSION: 1. Left parietal lobe oligemia with ischemic penumbra of 13 cc. No acute infarct by standard perfusion criteria. 2. Left M2 inferior division distal short segment of occlusion/near occlusion with patent downstream vessel. 3. 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 4. 15 mm nodule in the right lobe of thyroid gland,  thyroid ultrasound is recommended on a nonemergent basis. These results were called by telephone at the time of interpretation on 02/10/2018 at 7:33 pm to Dr. Larae Grooms , who verbally acknowledged these results. Electronically Signed   By: Kristine Garbe M.D.   On: 01/30/2018 19:39   Mr Brain Wo Contrast 02/01/2018 CLINICAL DATA:  Acute onset of aphasia and facial droop at 2:30 p.m. Follow-up LEFT M2 occlusion. History of hypertension. EXAM: MRI HEAD WITHOUT CONTRAST TECHNIQUE: Multiplanar, multiecho pulse sequences of the brain and surrounding structures were obtained without intravenous contrast. COMPARISON:  CT HEAD January 31, 2018 FINDINGS: INTRACRANIAL CONTENTS: Patchy reduced diffusion LEFT posterior insula and LEFT parietal cortex with low ADC values. Numerous scattered supra infratentorial chronic microhemorrhages. Ventricles and sulci are normal for patient's age. No midline shift, mass effect or masses. Old small RIGHT cerebellar infarct. Numerous supratentorial subcentimeter white matter flared T2 hyperintensities with cystic component. No abnormal extra-axial fluid collections. VASCULAR: Normal major intracranial vascular flow voids present at skull base. SKULL AND UPPER CERVICAL SPINE: No abnormal sellar expansion. Nonspecific low signal calvarium. Craniocervical junction maintained. SINUSES/ORBITS: The mastoid air-cells and included paranasal sinuses are well-aerated.The included ocular globes and orbital contents are non-suspicious. Status post bilateral ocular lens implants. OTHER: None. IMPRESSION: 1. Multifocal acute small LEFT MCA territory nonhemorrhagic infarcts. 2. Numerous chronic microhemorrhages suggesting amyloid angiopathy. 3. Mild-to-moderate chronic small vessel ischemic changes. Electronically Signed   By: Elon Alas M.D.   On: 02/01/2018 00:27   Ct Cerebral  Perfusion W Contrast 02/08/2018 CLINICAL DATA:  84 y/o F; slurred speech, extremity weakness, and  confusion, symptoms resolved. EXAM: CT ANGIOGRAPHY HEAD AND NECK CT PERFUSION BRAIN TECHNIQUE: Multidetector CT imaging of the head and neck was performed using the standard protocol during bolus administration of intravenous contrast. Multiplanar CT image reconstructions and MIPs were obtained to evaluate the vascular anatomy. Carotid stenosis measurements (when applicable) are obtained utilizing NASCET criteria, using the distal internal carotid diameter as the denominator. Multiphase CT imaging of the brain was performed following IV bolus contrast injection. Subsequent parametric perfusion maps were calculated using RAPID software. CONTRAST:  152mL ISOVUE-370 IOPAMIDOL (ISOVUE-370) INJECTION 76% COMPARISON:  01/30/2018 CT head.  07/28/2017 carotid duplex. FINDINGS: CTA NECK FINDINGS Aortic arch: Standard branching. Imaged portion shows no evidence of aneurysm or dissection. No significant stenosis of the major arch vessel origins. Right carotid system: No evidence of dissection, stenosis (50% or greater) or occlusion. Left carotid system: No evidence of dissection, stenosis (50% or greater) or occlusion. Vertebral arteries: Codominant. No evidence of dissection, stenosis (50% or greater) or occlusion. Skeleton: Mild cervical spondylosis. C6-7 grade 1 anterolisthesis with prominent facet arthropathy. No high-grade bony canal stenosis. Other neck: 15 mm nodule in the right lobe of thyroid (series 6, image 74). Upper chest: Negative. Review of the MIP images confirms the above findings CTA HEAD FINDINGS Anterior circulation: 6 mm anteriorly directed aneurysm arising from the distal horizontal petrous right ICA (series 7, image 211 and series 9, image 59). Left MCA M2 inferior division short segment distal occlusion/near occlusion at the level of the dorsal insula (series 12, image 23), with patent downstream vessel. No additional large vessel occlusion, aneurysm, or high-grade stenosis. Posterior circulation: Mild  left P1 stenosis. No significant proximal stenosis, proximal occlusion, aneurysm, or vascular malformation. Segments of stenosis in the distal PCA circulation bilaterally. Venous sinuses: As permitted by contrast timing, patent. Anatomic variants: Anterior communicating artery and left posterior communicating arteries are present. No right posterior communicating artery identified, likely hypoplastic or absent. Delayed phase: No abnormal intracranial enhancement. Review of the MIP images confirms the above findings CT Brain Perfusion Findings: CBF (<30%) Volume: 41mL Perfusion (Tmax>6.0s) volume: 13 ccmL Mismatch Volume: 13 ccmL Infarction Location:Left parietal lobe oligemia. No infarct by standard perfusion criteria. IMPRESSION: 1. Left parietal lobe oligemia with ischemic penumbra of 13 cc. No acute infarct by standard perfusion criteria. 2. Left M2 inferior division distal short segment of occlusion/near occlusion with patent downstream vessel. 3. 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 4. 15 mm nodule in the right lobe of thyroid gland, thyroid ultrasound is recommended on a nonemergent basis. These results were called by telephone at the time of interpretation on 02/08/2018 at 7:33 pm to Dr. Larae Grooms , who verbally acknowledged these results. Electronically Signed   By: Kristine Garbe M.D.   On: 02/19/2018 19:39    PHYSICAL EXAM Vitals:   02/03/18 0030 02/03/18 0307 02/03/18 0730 02/03/18 1130  BP: (!) 98/50 112/64 124/67 118/64  Pulse: (!) 105 (!) 103 88 (!) 102  Resp: 18 19 18 18   Temp: 98.9 F (37.2 C) 98.3 F (36.8 C) 98 F (36.7 C) 98.7 F (37.1 C)  TempSrc: Oral Oral Oral Oral  SpO2: 95% 95% 95% 97%  Weight:      Height:        Pleasant frail elderly lady, lethargic, in mild distress with nausea. Afebrile. Head is nontraumatic. Neck is supple without bruit.  Cardiac exam no murmur or gallop. Lungs are clear to  auscultation. Distal pulses are well felt. Neurological Exam ;  Awake, mild lethargy, oriented to self, but not to person, place and time. Expressive aphasia, able to follow simple commands, able to repeat simple sentences but not able to name. Minimum right lower facial asymmetry when she smiles.Marland Kitchendiminished attention, registration and recall. Eye movements full without nystagmus. fundi were not visualized. Vision acuity and fields appear normal. Hearing is diminished bilaterallyl. Palatal movements are normal. Tongue midline. Symmetical strength, tone, reflexes and coordination. Sensation, coordination and gait not tested.   ASSESSMENT/PLAN Kaitlyn Bauer is a 84 y.o. female with history of HTN and AF not on AC presenting to [facility] with aphasia and facial droop. famiy refused tPA. Improved upon arrival to [facility] so IR not indicated.   Stroke:  Multiple left MCA infarcts embolic secondary to known atrial fibrillation source not on [facility]  Code Stroke CT head No acute stroke. ASPECTS 10.     CTA head & neck L M2 occlusion. R ICA 14mm aneurysm. R thyroid lobe 15 mm nodule  CT perfusion parietal penumbra 13. No acute infarct  MRI  Mult small L MCA infarcts. Small vessel disease.   2D Echo - 60% to 65%. - Poor acoustic windows limit study.  EEG - no seizure  LDL 98  HgbA1c 5.9  Heparin 5000 units sq tid for VTE prophylaxis  Reports she did NOT take aspirin 325 mg daily prior to admission as reported.  Now on eliquis 2.5mg  bid, continue on discharge  Therapy recommendations:  [place] PT recommended.   Disposition:  pending   Atrial Fibrillation  Home anticoagulation:  none   CHA2DS2-VASc Score = at least 4, ?2 oral anticoagulation recommended  Age in Years:  ?21   +2    Sex:  Female   +1    Hypertension History:  yes   +1     Diabetes Mellitus:  0  Congestive Heart Failure History:  0  Vascular Disease History:  0     Stroke/TIA/Thromboembolism History:  yes   +2 . Only 1 reported fall  when she was in a hurry - does not fall otherwise. Agreeable to be careful and slower in her movements . Add Eliquis (apixaban) 2.5mg  bid  Lethargy   UA WBC 21-50  UCx negative   CXR neg  On doxycycline at home  Added Macrobid - however developed N/V and abdominal pain  Change macrobid to bactrium  Leukocytosis  WBC 15.6->23.5->28.3->19.0  Improving  Continue Abx  Hypertension  Stable . Home meds:  norvasc 10, hydralazine 25 tid . Resume home BP meds . Long-term BP goal normotensive  Hyperlipidemia  Home meds:  No statin  LDL 98, goal < 70  Add lipitor 10 (dtr states she likely will not take, does not like to take meds if they make her feel bad)  Continue statin at discharge  Other Active Problems  R thyroid nodule 15 mm, needs OP [place]   Hospital day # 3  Rosalin Hawking, MD PhD Stroke Neurology 02/04/2018 12:38 AM  To contact Stroke Continuity provider, please refer to [email or web address] After hours, contact General Neurology

## 2018-02-04 ENCOUNTER — Inpatient Hospital Stay (HOSPITAL_COMMUNITY): Payer: Medicare HMO

## 2018-02-04 LAB — BASIC METABOLIC PANEL
ANION GAP: 9 (ref 5–15)
BUN: 24 mg/dL — ABNORMAL HIGH (ref 8–23)
CALCIUM: 8.9 mg/dL (ref 8.9–10.3)
CO2: 29 mmol/L (ref 22–32)
Chloride: 101 mmol/L (ref 98–111)
Creatinine, Ser: 1.08 mg/dL — ABNORMAL HIGH (ref 0.44–1.00)
GFR calc Af Amer: 50 mL/min — ABNORMAL LOW (ref >60)
GFR, EST NON AFRICAN AMERICAN: 43 mL/min — AB (ref >60)
Glucose, Bld: 147 mg/dL — ABNORMAL HIGH (ref 70–99)
POTASSIUM: 3.7 mmol/L (ref 3.5–5.1)
SODIUM: 139 mmol/L (ref 135–145)

## 2018-02-04 LAB — CBC WITH DIFFERENTIAL/PLATELET
BASOS ABS: 0 10*3/uL (ref 0.0–0.1)
Basophils Relative: 0 %
Eosinophils Absolute: 0 10*3/uL (ref 0.0–0.7)
Eosinophils Relative: 0 %
HCT: 40.6 % (ref 36.0–46.0)
Hemoglobin: 13.3 g/dL (ref 12.0–15.0)
LYMPHS ABS: 16.1 10*3/uL — AB (ref 0.7–4.0)
Lymphocytes Relative: 57 %
MCH: 30.9 pg (ref 26.0–34.0)
MCHC: 32.8 g/dL (ref 30.0–36.0)
MCV: 94.2 fL (ref 78.0–100.0)
MONO ABS: 1.1 10*3/uL — AB (ref 0.1–1.0)
Monocytes Relative: 4 %
NEUTROS ABS: 11 10*3/uL — AB (ref 1.7–7.7)
Neutrophils Relative %: 39 %
PLATELETS: 242 10*3/uL (ref 150–400)
RBC: 4.31 MIL/uL (ref 3.87–5.11)
RDW: 14.3 % (ref 11.5–15.5)
WBC: 28.2 10*3/uL — AB (ref 4.0–10.5)

## 2018-02-04 LAB — GLUCOSE, CAPILLARY
GLUCOSE-CAPILLARY: 157 mg/dL — AB (ref 70–99)
GLUCOSE-CAPILLARY: 137 mg/dL — AB (ref 70–99)
Glucose-Capillary: 149 mg/dL — ABNORMAL HIGH (ref 70–99)
Glucose-Capillary: 157 mg/dL — ABNORMAL HIGH (ref 70–99)

## 2018-02-04 LAB — CBC
HCT: 42.5 % (ref 36.0–46.0)
Hemoglobin: 14 g/dL (ref 12.0–15.0)
MCH: 30.7 pg (ref 26.0–34.0)
MCHC: 32.9 g/dL (ref 30.0–36.0)
MCV: 93.2 fL (ref 78.0–100.0)
PLATELETS: 239 10*3/uL (ref 150–400)
RBC: 4.56 MIL/uL (ref 3.87–5.11)
RDW: 14.3 % (ref 11.5–15.5)
WBC: 26.3 10*3/uL — ABNORMAL HIGH (ref 4.0–10.5)

## 2018-02-04 MED ORDER — IOHEXOL 300 MG/ML  SOLN
80.0000 mL | Freq: Once | INTRAMUSCULAR | Status: AC | PRN
  Administered 2018-02-04: 100 mL via INTRAVENOUS

## 2018-02-04 MED ORDER — FLEET ENEMA 7-19 GM/118ML RE ENEM
1.0000 | ENEMA | Freq: Once | RECTAL | Status: AC
  Administered 2018-02-04: 1 via RECTAL
  Filled 2018-02-04: qty 1

## 2018-02-04 NOTE — Progress Notes (Signed)
STROKE TEAM PROGRESS NOTE  INTERVAL HISTORY Her daughter [name] is at the bedside. Pt continues to have nausea with small amount of vomiting. Continue to have significant leukocytosis. Blood culture pending. Urine culture neg. Significant and diffuse abdominal tenderness on palpation. Concerning for bowel obstruction, will do KUB and CT abdomen and pelvis stat.    Vitals:   02/04/18 0017 02/04/18 0430 02/04/18 0750 02/04/18 1013  BP: (!) 122/59 (!) 138/52 126/71 (!) 127/54  Pulse: (!) 101  (!) 101 98  Resp: 16  18   Temp: 98 F (36.7 C) 97.9 F (36.6 C) 98.2 F (36.8 C)   TempSrc: Oral Axillary Oral   SpO2: 96%  97%   Weight:      Height:        CBC:  Recent Labs  Lab 02/06/2018 1712  02/04/18 0409 02/04/18 0745  WBC 13.2*   < > 26.3* 28.2*  NEUTROABS 5.4  --   --  11.0*  HGB 13.5   < > 14.0 13.3  HCT 39.4   < > 42.5 40.6  MCV 93.0   < > 93.2 94.2  PLT 208   < > 239 242   < > = values in this interval not displayed.    Basic Metabolic Panel:  Recent Labs  Lab 02/03/18 0655 02/04/18 0409  NA 136 139  K 3.9 3.7  CL 101 101  CO2 27 29  GLUCOSE 121* 147*  BUN 20 24*  CREATININE 1.08* 1.08*  CALCIUM 9.1 8.9   Lipid Panel:     Component Value Date/Time   CHOL 160 02/01/2018 0243   TRIG 95 02/01/2018 0243   HDL 43 02/01/2018 0243   CHOLHDL 3.7 02/01/2018 0243   VLDL 19 02/01/2018 0243   LDLCALC 98 02/01/2018 0243   HgbA1c:  Lab Results  Component Value Date   HGBA1C 5.9 (H) 02/01/2018   Urine Drug Screen: No results found for: LABOPIA, COCAINSCRNUR, LABBENZ, AMPHETMU, THCU, LABBARB  Alcohol Level No results found for: [place] I have personally reviewed the radiological images below and agree with the radiology interpretations.  Ct Head Wo Contrast 02/02/2018 IMPRESSION:  1. Small left parietal lobe cortical infarctions stable in distribution in comparison with prior MRI of the head.  2. No new acute intracranial abnormality.  3. Stable background of  chronic microvascular ischemic changes and parenchymal volume loss of the brain.    Ct Angio Head W Or Wo Contrast Ct Angio Neck W Or Wo Contrast Ct Cerebral Perfusion W Contrast 02/18/2018 IMPRESSION:  1. Left parietal lobe oligemia with ischemic penumbra of 13 cc. No acute infarct by standard perfusion criteria.  2. Left M2 inferior division distal short segment of occlusion/near occlusion with patent downstream vessel.  3. 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  4. 15 mm nodule in the right lobe of thyroid gland, thyroid ultrasound is recommended on a nonemergent basis.   MR Brain WO Contrast 02/01/2018 IMPRESSION:  1. Multifocal acute small LEFT MCA territory nonhemorrhagic infarcts.  2. Numerous chronic microhemorrhages suggesting amyloid angiopathy.  3. Mild-to-moderate chronic small vessel ischemic changes.   Dg Chest Port 1 View 02/03/2018 IMPRESSION:  No active disease.   EEG 02/02/2018 Background: There is a posterior dominant rhythm of 8 Hz which is well-formed.  In addition, there is focal left-sided delta and theta activity which is maximal in the left anterior temporal region (F7, T7).  Sleep is recorded with symmetric structures Photic  stimulation: Physiologic driving is not performed EEG Abnormalities: 1) left temporal slow activity Clinical Interpretation: This EEG is consistent with a focal area of cerebral dysfunction centered in the left anterior temporal region. There was no seizure or seizure predisposition recorded on this study. Please note that a normal EEG does not preclude the possibility of epilepsy.   Transthoracic Echocardiogram  02/01/2018 Study Conclusions - Left ventricle: The cavity size was normal. Wall thickness was   increased in a pattern of mild LVH. Systolic function was normal.   The estimated ejection fraction was in the range of 60% to 65%. - Aortic valve: There was mild regurgitation. Valve  area (VTI):   2.66 cm^2. Valve area (Vmax): 2.98 cm^2. Valve area (Vmean): 2.7   cm^2. Impressions: - Poor acoustic windows limit study.  KUB pending  CT abdomen/pelvis pending    PHYSICAL EXAM Vitals:   02/04/18 0017 02/04/18 0430 02/04/18 0750 02/04/18 1013  BP: (!) 122/59 (!) 138/52 126/71 (!) 127/54  Pulse: (!) 101  (!) 101 98  Resp: 16  18   Temp: 98 F (36.7 C) 97.9 F (36.6 C) 98.2 F (36.8 C)   TempSrc: Oral Axillary Oral   SpO2: 96%  97%   Weight:      Height:        Pleasant frail elderly lady, lethargic, in mild distress with nausea. Afebrile. Head is nontraumatic. Neck is supple without bruit.    Cardiac exam no murmur or gallop. Lungs are clear to auscultation. Distal pulses are well felt. However, significant and diffuse abdominal tenderness on palpation. Neurological Exam ;  Awake, mild lethargy, oriented to self, but not to person, place and time. Expressive aphasia, able to follow simple commands, able to repeat simple sentences but not able to name. Minimum right lower facial asymmetry when she smiles.Marland Kitchendiminished attention, registration and recall. Eye movements full without nystagmus. fundi were not visualized. Vision acuity and fields appear normal. Hearing is diminished bilaterallyl. Palatal movements are normal. Tongue midline. Symmetical strength, tone, reflexes and coordination. Sensation, coordination and gait not tested.   ASSESSMENT/PLAN Kaitlyn Bauer is a 84 y.o. female with history of HTN and AF not on AC presenting to [facility] with aphasia and facial droop. famiy refused tPA. Improved upon arrival to [facility] so IR not indicated.   Stroke:  Multiple left MCA infarcts embolic secondary to known atrial fibrillation source not on [facility]  Code Stroke CT head No acute stroke. ASPECTS 10.     CTA head & neck L M2 occlusion. R ICA 67mm aneurysm. R thyroid lobe 15 mm nodule  CT perfusion parietal penumbra 13. No acute infarct  MRI  Mult small L MCA infarcts.  Small vessel disease.   2D Echo - 60% to 65%. - Poor acoustic windows limit study.  EEG - no seizure  LDL 98  HgbA1c 5.9  Heparin 5000 units sq tid for VTE prophylaxis  Reports she did NOT take aspirin 325 mg daily prior to admission as reported.  Now on eliquis 2.5mg  bid, continue on discharge  Therapy recommendations:  [place] PT recommended.   Disposition:  pending   Atrial Fibrillation  Home anticoagulation:  none   CHA2DS2-VASc Score = at least 4, ?2 oral anticoagulation recommended  Age in Years:  ?51   +2    Sex:  Female   +1    Hypertension History:  yes   +1     Diabetes Mellitus:  0  Congestive Heart Failure History:  0  Vascular Disease History:  0     Stroke/TIA/Thromboembolism History:  yes   +2 . Only 1 reported fall when she was in a hurry - does not fall otherwise. Agreeable to be careful and slower in her movements . Add Eliquis (apixaban) 2.5mg  bid  Abdominal pain    UA WBC 21-50  UCx negative   CXR neg  On doxycycline at home  Added Macrobid - however developed N/V and abdominal pain  Change macrobid to bactrium - Day # 2 / 3  Significant and diffuse abdominal tenderness on palpation. Will do KUB and CT abdomen/pelvis  Leukocytosis  WBC 15.6->23.5->28.3->19.0 -> 28.2 (afebrile)   Continue Abx  Urine culture negative  Blood culture pending  Hypertension  Stable . Home meds:  norvasc 10, hydralazine 25 tid . Resume home BP meds . Long-term BP goal normotensive  Hyperlipidemia  Home meds:  No statin  LDL 98, goal < 70  Add lipitor 10 (dtr states she likely will not take, does not like to take meds if they make her feel bad)  Continue statin at discharge  Other Active Problems  R thyroid nodule 15 mm, needs OP [place]   Hospital day # 4   I spent  35 minutes in total face-to-face time with the patient, more than 50% of which was spent in counseling and coordination of care, reviewing test results, images and medication, and  discussing the diagnosis of abdominal pain, significant leukocytosis, treatment plan and potential prognosis. This patient's care requiresreview of multiple databases, neurological assessment, discussion with family, other specialists and medical decision making of high complexity. I had long discussion with daughter at bedside, updated pt current condition, treatment plan and potential prognosis. They expressed understanding and appreciation.   Rosalin Hawking, MD PhD Stroke Neurology 02/04/2018 11:01 PM     To contact Stroke Continuity provider, please refer to [email or web address] After hours, contact General Neurology

## 2018-02-04 NOTE — Progress Notes (Signed)
Pt vomited several times around 0400. Gave PRN IV zofran, which was effective.

## 2018-02-05 DIAGNOSIS — Z880 Allergy status to penicillin: Secondary | ICD-10-CM

## 2018-02-05 DIAGNOSIS — N183 Chronic kidney disease, stage 3 unspecified: Secondary | ICD-10-CM | POA: Diagnosis present

## 2018-02-05 DIAGNOSIS — N63 Unspecified lump in unspecified breast: Secondary | ICD-10-CM

## 2018-02-05 DIAGNOSIS — D72829 Elevated white blood cell count, unspecified: Secondary | ICD-10-CM

## 2018-02-05 DIAGNOSIS — Z7901 Long term (current) use of anticoagulants: Secondary | ICD-10-CM

## 2018-02-05 DIAGNOSIS — I10 Essential (primary) hypertension: Secondary | ICD-10-CM | POA: Diagnosis present

## 2018-02-05 DIAGNOSIS — N2889 Other specified disorders of kidney and ureter: Secondary | ICD-10-CM

## 2018-02-05 DIAGNOSIS — I4891 Unspecified atrial fibrillation: Secondary | ICD-10-CM

## 2018-02-05 DIAGNOSIS — Z66 Do not resuscitate: Secondary | ICD-10-CM

## 2018-02-05 DIAGNOSIS — N261 Atrophy of kidney (terminal): Secondary | ICD-10-CM | POA: Diagnosis present

## 2018-02-05 DIAGNOSIS — R739 Hyperglycemia, unspecified: Secondary | ICD-10-CM | POA: Diagnosis present

## 2018-02-05 DIAGNOSIS — K560 Paralytic ileus: Secondary | ICD-10-CM | POA: Diagnosis present

## 2018-02-05 DIAGNOSIS — Z87891 Personal history of nicotine dependence: Secondary | ICD-10-CM

## 2018-02-05 DIAGNOSIS — E041 Nontoxic single thyroid nodule: Secondary | ICD-10-CM | POA: Diagnosis present

## 2018-02-05 DIAGNOSIS — I63512 Cerebral infarction due to unspecified occlusion or stenosis of left middle cerebral artery: Secondary | ICD-10-CM

## 2018-02-05 LAB — CBC WITH DIFFERENTIAL/PLATELET
BASOS ABS: 0 10*3/uL (ref 0.0–0.1)
Basophils Relative: 0 %
EOS ABS: 0 10*3/uL (ref 0.0–0.7)
Eosinophils Relative: 0 %
HCT: 38.3 % (ref 36.0–46.0)
HEMOGLOBIN: 12.5 g/dL (ref 12.0–15.0)
LYMPHS PCT: 54 %
Lymphs Abs: 17 10*3/uL — ABNORMAL HIGH (ref 0.7–4.0)
MCH: 30.9 pg (ref 26.0–34.0)
MCHC: 32.6 g/dL (ref 30.0–36.0)
MCV: 94.6 fL (ref 78.0–100.0)
Monocytes Absolute: 2.5 10*3/uL — ABNORMAL HIGH (ref 0.1–1.0)
Monocytes Relative: 8 %
NEUTROS ABS: 11.9 10*3/uL — AB (ref 1.7–7.7)
Neutrophils Relative %: 38 %
Platelets: 241 10*3/uL (ref 150–400)
RBC: 4.05 MIL/uL (ref 3.87–5.11)
RDW: 14.6 % (ref 11.5–15.5)
WBC: 31.4 10*3/uL — AB (ref 4.0–10.5)

## 2018-02-05 LAB — BASIC METABOLIC PANEL
ANION GAP: 8 (ref 5–15)
BUN: 25 mg/dL — ABNORMAL HIGH (ref 8–23)
CO2: 27 mmol/L (ref 22–32)
Calcium: 8.3 mg/dL — ABNORMAL LOW (ref 8.9–10.3)
Chloride: 105 mmol/L (ref 98–111)
Creatinine, Ser: 1.2 mg/dL — ABNORMAL HIGH (ref 0.44–1.00)
GFR calc Af Amer: 44 mL/min — ABNORMAL LOW (ref >60)
GFR, EST NON AFRICAN AMERICAN: 38 mL/min — AB (ref >60)
GLUCOSE: 132 mg/dL — AB (ref 70–99)
Potassium: 3.8 mmol/L (ref 3.5–5.1)
SODIUM: 140 mmol/L (ref 135–145)

## 2018-02-05 LAB — GLUCOSE, CAPILLARY
GLUCOSE-CAPILLARY: 140 mg/dL — AB (ref 70–99)
Glucose-Capillary: 135 mg/dL — ABNORMAL HIGH (ref 70–99)

## 2018-02-05 LAB — PATHOLOGIST SMEAR REVIEW

## 2018-02-05 NOTE — Consult Note (Signed)
Medical Consultation   [name]  QBV:[id number]  DOB: 04/15/1922  DOA: 01/30/2018  PCP: Baxter Hire, MD   Outpatient Specialists:  None   Requesting physician: Erlinda Hong - neurology  Reason for consultation: From the stroke perspective, she is doing well.  But her leukocytosis is worsening.  CTA C/P with multiple findings.  Partial ileus with bouts of n/v.  Her daughter reports no h/o these medical issues.  +UTI - Macrobid, changed to Bactrim.     History of Present Illness: Kaitlyn Bauer is an 84 y.o. female with h/o HTN recently admitted for multifocal acute left MCA territory nonhemorrhagic infarcts.  " I just feel ... Leaking, got food.  I just aint had no..." Thursday, speech therapy thought she was doing well.  Friday, she was having significant difficulty - unable to speak, confused.  Clearly with an expressive aphasia.  ?abdominal pain, mild nausea with vomiting over the weekend.  She was previously very inependent, lived alone.  She has 4 daughters and 1 son, [name] being the primary contact.   Review of Systems:  ROS Unable to perform   Past Medical History: Past Medical History:  Diagnosis Date  . Hypertension     Past Surgical History: Past Surgical History:  Procedure Laterality Date  . ABDOMINAL HYSTERECTOMY    . APPENDECTOMY    . CHOLECYSTECTOMY       Allergies:   Allergies  Allergen Reactions  . Penicillins Hives    Has patient had a PCN reaction causing immediate rash, facial/tongue/throat swelling, SOB or lightheadedness with hypotension: Unknown Has patient had a PCN reaction causing severe rash involving mucus membranes or skin necrosis: Unknown Has patient had a PCN reaction that required hospitalization: Unknown Has patient had a PCN reaction occurring within the last 10 years: Unknown If all of the above answers are "NO", then may proceed with Cephalosporin use.      Social History:  reports that she has quit smoking.  She has never used smokeless tobacco. She reports that she does not drink alcohol or use drugs.   Family History: Family History  Family history unknown: Yes      Physical Exam: Vitals:   02/05/18 0020 02/05/18 0442 02/05/18 0738 02/05/18 1140  BP: (!) 108/54 (!) 112/52 (!) 121/55 (!) 115/47  Pulse: (!) 109 (!) 102 (!) 102 98  Resp: 18 18 18 18   Temp: 98 F (36.7 C) 98.6 F (37 C) 98.7 F (37.1 C) 98.3 F (36.8 C)  TempSrc:   Oral Oral  SpO2: 92% 95% 92% 98%  Weight:      Height:        Constitutional: Alert and awake, oriented x1, not in any acute distress. Eyes:  EOMI, irises appear normal, anicteric sclera,  ENMT: external ears and nose appear normal, hard of hearing, Lips appear normal, left facial droop Neck: neck appears normal, no masses, normal ROM, no thyromegaly, no JVD, no carotid bruit  CVS: S1-S2 clear, no murmur rubs or gallops, no LE edema, normal pedal pulses  Respiratory:  clear to auscultation bilaterally, no wheezing, rales or rhonchi. Respiratory effort normal. No accessory muscle use.  Abdomen: soft ?tender to palpation - possibly because patient flinched initially but was then able to palpate without apparent tenderness so this is not clear;  Mildly distended, normal bowel sounds, no hepatosplenomegaly, no hernias  Musculoskeletal: : no cyanosis, clubbing or edema noted bilaterally  Neuro: mild left facial droop, strength, sensation, reflexes Psych: expressive aphasia Skin: no rashes or lesions or ulcers, no induration or nodules    Data reviewed:  I have personally reviewed the recent labs and imaging studies  Pertinent Labs:   Glucose 132 BUN 25/Creatinine 1.20/GFR 38 WBC 31.4, 38% neutrophils; smudge cells present.  WBC counts range from 11.6-31.4 since 1/19 but range from 11.2-11.8 in the [facility] system from 2016-present.   Inpatient Medications:   Scheduled Meds: . amLODipine  10 mg Oral Daily  . apixaban  2.5 mg Oral BID  . atorvastatin  10  mg Oral q1800  . doxycycline  100 mg Oral BID  . hydrALAZINE  25 mg Oral TID  . multivitamin with minerals  1 tablet Oral QAC supper  . sulfamethoxazole-trimethoprim  1 tablet Oral Q12H   Continuous Infusions: . sodium chloride 50 mL/hr at 02/04/18 1800  . sodium chloride Stopped (02/01/18 0843)     Radiological Exams on Admission: Ct Abdomen Pelvis W Contrast  Result Date: 02/04/2018 CLINICAL DATA:  84 y/o F; nausea, vomiting, and abdominal pain with leukocytosis. EXAM: CT ABDOMEN AND PELVIS WITH CONTRAST TECHNIQUE: Multidetector CT imaging of the abdomen and pelvis was performed using the standard protocol following bolus administration of intravenous contrast. CONTRAST:  116mL OMNIPAQUE IOHEXOL 300 MG/ML  SOLN COMPARISON:  None. FINDINGS: Lower chest: Small bilateral pleural effusions. Small hiatal hernia and mild fluid-filled esophageal distention. Two left breast masses measuring 4.0 cm and 2.8 cm (series 3, image 8 and 10). 8 mm nodule in the dependent right lower lobe (series 4, image 10). Hepatobiliary: No focal liver abnormality is seen. No gallstones, gallbladder wall thickening, or biliary dilatation. Pancreas: Unremarkable. No pancreatic ductal dilatation or surrounding inflammatory changes. Spleen: Normal in size without focal abnormality. Adrenals/Urinary Tract: Atrophic left kidney. Masslike distention of the left renal pelvis measuring up to 2.2 cm. Widening of the left proximal ureter. Multiple small right kidney peripelvic cysts. No additional right kidney focal lesion identified. Normal adrenal glands. Normal right ureter. Normal bladder. Stomach/Bowel: Diffuse distention of the stomach and small bowel tapering to a collapsed terminal ileum. Extensive sigmoid diverticulosis, no findings of acute diverticulitis. Appendix not identified. Vascular/Lymphatic: Aortic atherosclerosis. No enlarged abdominal or pelvic lymph nodes. Reproductive: Status post hysterectomy. No adnexal masses.  Other: Small volume of ascites and mild mesenteric edema. Musculoskeletal: No fracture is seen. Multilevel degenerative changes of the spine. IMPRESSION: 1. Diffuse distention of small bowel tapering to a collapsed terminal ileum. Findings suggest enteritis or partial obstruction. 2. Small volume of ascites and mesenteric edema, possibly reactive inflammation or third-spacing. 3. Two left breast masses measuring up to 4.0 cm. Breast malignancy is suspected and clinical correlation is recommended. 4. Atrophic left kidney. Masslike distension of the left renal pelvis with widened proximal ureter. Findings may represent urothelial carcinoma or pyelitis. 5. 8 mm right lower lobe pulmonary nodule. 6. Small bilateral pleural effusions. 7. Small hiatal hernia and dilated fluid-filled lower esophagus. 8. Sigmoid diverticulosis without findings of acute diverticulitis. Electronically Signed   By: Kristine Garbe M.D.   On: 02/04/2018 19:34   Dg Abd Portable 1v  Result Date: 02/04/2018 CLINICAL DATA:  Nausea vomiting.  Abdominal pain. EXAM: PORTABLE ABDOMEN - 1 VIEW COMPARISON:  None. FINDINGS: Mildly dilated loops of small bowel in the central to right lower abdomen. Some air and stool is noted in the rectum. No renal or ureteral stones. There are several phleboliths in the pelvis. Status post cholecystectomy. No acute skeletal abnormality.  IMPRESSION: 1. Mild dilation of small bowel loops. Findings may reflect an adynamic ileus or a low grade partial obstruction. Electronically Signed   By: Lajean Manes M.D.   On: 02/04/2018 18:47    Impression/Recommendations Principal Problem:   Acute ischemic left MCA stroke (HCC) Active Problems:   Essential hypertension   Adynamic ileus (HCC)   Left breast mass   Leukocytosis   Left renal atrophy   Hyperglycemia   Chronic kidney disease, stage 3, mod decreased GFR (HCC)   Thyroid nodule  Acute ischemic stroke -Patient presented on 7/10 with acute  CVA -She initially did well with evaluations but had apparent worsening in the early part of the weekend -Initial speech assessment was unremarkable, but this likely needs to be repeated -PT/OT have cleared her for home, but there are clear concerns with her ability to function independently at this time -SW consult for placement has already been requested by stroke team -Further care of this issue by the stroke team on an ongoing basis  Ileus -Patient with n/v and ileus vs. Partial SBO by imaging -Suggest ongoing NPO, monitoring, Zofran -If she is having ongoing symptoms, repeat KUB would be reasonable -If N/V worsens, can place an NG tube  HTN -Home Norvasc has been resumed -Hydralazine added  L breast masses -2 left sided breast masses about 4 cm diagnosed on CT -Per Dr. Jana Hakim, depending on estrogen receptor status, a relatively straightforward treatment may be available -Will defer to oncology  Leukocytosis -Chronic, but acutely worsened -She was previously worked up at [facility] for possible CLL -Suspect that this is acute on chronic CLL -There is no current evidence otherwise of active infection -She is on doxycycline and Bactrim - neither of these medications appears to be indicated based on current evidence -Oncology consult has been requested  Left renal atrophy -CT indicates "masslike distention of the left renal pelvis with widened proximal ureter" concerning for urothelial carcinoma or pyelitis -Oncology consulted  Hyperglycemia -May be stress response -Will follow with fasting AM labs -It is unlikely that she will need acute or chronic treatment for this issue  CKD -GFR in April and July at [facility] was 41 -Suspect stage 3 CKD related to long-standing HTN -Will follow  Thyroid nodule -Eventual outpatient [place] may be appropriate  Goals of care -Based on her age (44); prior extremely independent status with minimal outpatient care; and current possible 3 cancers - I  encouraged her daughter (primary decision-maker, [name]) to consider palliative care meeting with family -She is hesitant to have this type of meeting at this time -Instead, she requests oncology consult for further guidance -That said, she is clear that her mother has expressed desire for minimal intervention in the past - she is unlikely to desire surgery unless it will clearly show meaningful outcome, for example -The patient is DNR    Thank you for this consultation.  Our [facility] hospitalist team will follow the patient with you.   Time Spent: 75 minutes  Karmen Bongo M.D. [facility] 02/05/2018, 12:27 PM

## 2018-02-05 NOTE — Consult Note (Signed)
[facility]  [phone] [phone]     ID: JOURNIEE FELDKAMP DOB: 1921-11-06  MR#: [id number]  HQI#:[id number]  Patient Care Team: Baxter Hire, MD as PCP - General (Internal Medicine) Chauncey Cruel, MD OTHER MD:  CHIEF COMPLAINT: left breast masses; lymphocytosis; question left urothelial carcinoma  CURRENT TREATMENT: pending left breast biopsy   HISTORY OF CURRENT ILLNESS: Ms. Rosado was evaluated at [facility] 02/11/2018 after developing confusion and slurred speech; workup found a L-MCA clot (in this patient with atrial fibrillation, not on anticoagulation); TPA was discussed but refused and the patient was transferred to [facility] for possible mechanical thrombectomy which however was postponed as her symptoms improved; she was started on apixaban 02/03/2018  In course of evaluation a CT of the abdomen and pelvis was obtained 02/04/2018. This showed, among other things:  (a) two left breast masses, the larger 4.0 cm  (b) a possible left renal mass.  In addition the patient was found to have a persistent lymphocytosis.  We were consulted to address these issues.  INTERVAL HISTORY: ibuprofen met with the patient and her daughter Sissy Hoff in the patient's room on 02/05/2018  REVIEW OF SYSTEMS: Denies pain; tells me she had a normal BM yesterday; denies bleeding; had a UTI prior to admission per daughter; currently no headache, N/V or visual changes; denies focal weakness  PAST MEDICAL HISTORY: Past Medical History:  Diagnosis Date  . Hypertension     PAST SURGICAL HISTORY: Past Surgical History:  Procedure Laterality Date  . ABDOMINAL HYSTERECTOMY    . APPENDECTOMY    . CHOLECYSTECTOMY      FAMILY HISTORY Family History  Family history unknown: Yes    GYNECOLOGIC HISTORY:  No LMP recorded. Patient has had a hysterectomy. GX P5 Hysterectomy? yes . SOCIAL HISTORY:  Widowed, lives by herself; children in order of  birth are Marchelle Folks [facility], retired Scientist, clinical (histocompatibility and immunogenetics)); Alvis Tawil [employer]; retired Engineer, structural); Sissy [place]; works day care, 2 jobs); Laurell Roof, [place]-- disabled; [place], Phillip Heal, works in finances; >10 grandchildren; patient is a Tourist information centre manager    ADVANCED DIRECTIVES: not in place; patient had difficulty understanding the concept of a HCPOA   HEALTH MAINTENANCE: Social History   Tobacco Use  . Smoking status: Former Research scientist (life sciences)  . Smokeless tobacco: Never Used  Substance Use Topics  . Alcohol use: No    Frequency: Never  . Drug use: No        Allergies  Allergen Reactions  . Penicillins Hives    Has patient had a PCN reaction causing immediate rash, facial/tongue/throat swelling, SOB or lightheadedness with hypotension: Unknown Has patient had a PCN reaction causing severe rash involving mucus membranes or skin necrosis: Unknown Has patient had a PCN reaction that required hospitalization: Unknown Has patient had a PCN reaction occurring within the last 10 years: Unknown If all of the above answers are "NO", then may proceed with Cephalosporin use.     Current Facility-Administered Medications  Medication Dose Route Frequency Provider Last Rate Last Dose  . 0.9 %  sodium chloride infusion   Intravenous Continuous Burnetta Sabin L, NP 50 mL/hr at 02/04/18 1800    . 0.9 %  sodium chloride infusion   Intravenous Continuous Donzetta Starch, NP   Stopped at 02/01/18 0843  . acetaminophen (TYLENOL) tablet 650 mg  650 mg Oral Q4H PRN Donzetta Starch, NP       Or  . acetaminophen (TYLENOL) solution 650 mg  650  mg Per Tube Q4H PRN Donzetta Starch, NP       Or  . acetaminophen (TYLENOL) suppository 650 mg  650 mg Rectal Q4H PRN Donzetta Starch, NP      . amLODipine (NORVASC) tablet 10 mg  10 mg Oral Daily Burnetta Sabin L, NP   10 mg at 02/04/18 1008  . apixaban (ELIQUIS) tablet 2.5 mg  2.5 mg Oral BID Donzetta Starch, NP   2.5 mg at 02/04/18 2145  . atorvastatin (LIPITOR) tablet  10 mg  10 mg Oral q1800 Burnetta Sabin L, NP   10 mg at 02/03/18 1807  . doxycycline (VIBRA-TABS) tablet 100 mg  100 mg Oral BID Burnetta Sabin L, NP   100 mg at 02/04/18 2144  . hydrALAZINE (APRESOLINE) tablet 25 mg  25 mg Oral TID Donzetta Starch, NP   25 mg at 02/04/18 2145  . multivitamin with minerals tablet 1 tablet  1 tablet Oral QAC supper Garvin Fila, MD   1 tablet at 02/03/18 1807  . ondansetron (ZOFRAN) injection 4 mg  4 mg Intravenous Q6H PRN Donzetta Starch, NP   4 mg at 02/04/18 1633  . senna-docusate (Senokot-S) tablet 1 tablet  1 tablet Oral QHS PRN Donzetta Starch, NP      . sulfamethoxazole-trimethoprim (BACTRIM DS,SEPTRA DS) 800-160 MG per tablet 1 tablet  1 tablet Oral Q12H Rosalin Hawking, MD   1 tablet at 02/04/18 2144    OBJECTIVE: elderly White woman examined in bed  Vitals:   02/05/18 0738 02/05/18 1140  BP: (!) 121/55 (!) 115/47  Pulse: (!) 102 98  Resp: 18 18  Temp: 98.7 F (37.1 C) 98.3 F (36.8 C)  SpO2: 92% 98%     Body mass index is 25.16 kg/m.   Wt Readings from Last 3 Encounters:  02/01/18 133 lb 2.5 oz (60.4 kg)  02/03/2018 130 lb (59 kg)  07/27/17 140 lb (63.5 kg)    Ocular: Sclerae unicteric, EOMs intact, bilateral arcus senilis Lymphatic: No cervical or supraclavicular adenopathy Lungs no rales or rhonchi, auscultated anterolaterally Heart no murmur appreciated Abd soft, nontender, positive bowel sounds Neuro: non-focal, not oriented to year or place, but knows daughter; able to answer many questions clearly, other slurs words and cannot find words Breasts: right breast is unremarkable; the left breast shows a central mass measuring approximately 5 cm by palpation, some nipple induration, no erythema, no skin involvement; both axillae are benign   LAB RESULTS:  CMP     Component Value Date/Time   NA 140 02/05/2018 0730   K 3.8 02/05/2018 0730   CL 105 02/05/2018 0730   CO2 27 02/05/2018 0730   GLUCOSE 132 (H) 02/05/2018 0730   BUN 25 (H)  02/05/2018 0730   CREATININE 1.20 (H) 02/05/2018 0730   CALCIUM 8.3 (L) 02/05/2018 0730   PROT 6.6 02/04/2018 1712   ALBUMIN 3.6 01/30/2018 1712   AST 24 01/26/2018 1712   ALT 10 01/26/2018 1712   ALKPHOS 78 01/26/2018 1712   BILITOT 0.6 02/17/2018 1712   GFRNONAA 38 (L) 02/05/2018 0730   GFRAA 44 (L) 02/05/2018 0730    No results found for: TOTALPROTELP, ALBUMINELP, A1GS, A2GS, BETS, BETA2SER, GAMS, MSPIKE, SPEI  No results found for: KPAFRELGTCHN, LAMBDASER, KAPLAMBRATIO  Lab Results  Component Value Date   WBC 31.4 (H) 02/05/2018   NEUTROABS 11.9 (H) 02/05/2018   HGB 12.5 02/05/2018   HCT 38.3 02/05/2018   MCV 94.6 02/05/2018   PLT  241 02/05/2018    _0 @  No results found for: LABCA2  No components found for: [id number]  Recent Labs  Lab 02/01/2018 1712  INR 1.01    No results found for: LABCA2  No results found for: OZY248  No results found for: GNO037  No results found for: CWU889  No results found for: CA2729  No components found for: HGQUANT  No results found for: CEA1 / No results found for: CEA1   No results found for: AFPTUMOR  No results found for: CHROMOGRNA  No results found for: PSA1  Admission on 02/03/2018  No results displayed because visit has over 200 results.      (this displays the last labs from the last 3 days)  No results found for: TOTALPROTELP, ALBUMINELP, A1GS, A2GS, BETS, BETA2SER, GAMS, MSPIKE, SPEI (this displays SPEP labs)  No results found for: KPAFRELGTCHN, LAMBDASER, KAPLAMBRATIO (kappa/lambda light chains)  No results found for: HGBA, HGBA2QUANT, HGBFQUANT, HGBSQUAN (Hemoglobinopathy evaluation)   No results found for: LDH  No results found for: IRON, TIBC, IRONPCTSAT (Iron and TIBC)  No results found for: FERRITIN  Urinalysis    Component Value Date/Time   COLORURINE AMBER (A) 02/03/2018 0841   APPEARANCEUR CLOUDY (A) 02/03/2018 0841   LABSPEC 1.023 02/03/2018 0841   PHURINE 5.0  02/03/2018 0841   GLUCOSEU NEGATIVE 02/03/2018 0841   HGBUR NEGATIVE 02/03/2018 0841   BILIRUBINUR NEGATIVE 02/03/2018 0841   KETONESUR 5 (A) 02/03/2018 0841   PROTEINUR NEGATIVE 02/03/2018 0841   NITRITE NEGATIVE 02/03/2018 0841   LEUKOCYTESUR MODERATE (A) 02/03/2018 0841     STUDIES: Ct Angio Head W Or Wo Contrast  Result Date: 01/28/2018 CLINICAL DATA:  84 y/o F; slurred speech, extremity weakness, and confusion, symptoms resolved. EXAM: CT ANGIOGRAPHY HEAD AND NECK CT PERFUSION BRAIN TECHNIQUE: Multidetector CT imaging of the head and neck was performed using the standard protocol during bolus administration of intravenous contrast. Multiplanar CT image reconstructions and MIPs were obtained to evaluate the vascular anatomy. Carotid stenosis measurements (when applicable) are obtained utilizing NASCET criteria, using the distal internal carotid diameter as the denominator. Multiphase CT imaging of the brain was performed following IV bolus contrast injection. Subsequent parametric perfusion maps were calculated using RAPID software. CONTRAST:  172m ISOVUE-370 IOPAMIDOL (ISOVUE-370) INJECTION 76% COMPARISON:  02/09/2018 CT head.  07/28/2017 carotid duplex. FINDINGS: CTA NECK FINDINGS Aortic arch: Standard branching. Imaged portion shows no evidence of aneurysm or dissection. No significant stenosis of the major arch vessel origins. Right carotid system: No evidence of dissection, stenosis (50% or greater) or occlusion. Left carotid system: No evidence of dissection, stenosis (50% or greater) or occlusion. Vertebral arteries: Codominant. No evidence of dissection, stenosis (50% or greater) or occlusion. Skeleton: Mild cervical spondylosis. C6-7 grade 1 anterolisthesis with prominent facet arthropathy. No high-grade bony canal stenosis. Other neck: 15 mm nodule in the right lobe of thyroid (series 6, image 74). Upper chest: Negative. Review of the MIP images confirms the above findings CTA HEAD  FINDINGS Anterior circulation: 6 mm anteriorly directed aneurysm arising from the distal horizontal petrous right ICA (series 7, image 211 and series 9, image 59). Left MCA M2 inferior division short segment distal occlusion/near occlusion at the level of the dorsal insula (series 12, image 23), with patent downstream vessel. No additional large vessel occlusion, aneurysm, or high-grade stenosis. Posterior circulation: Mild left P1 stenosis. No significant proximal stenosis, proximal occlusion, aneurysm, or vascular malformation. Segments of stenosis in the distal PCA circulation bilaterally. Venous sinuses: As permitted  by contrast timing, patent. Anatomic variants: Anterior communicating artery and left posterior communicating arteries are present. No right posterior communicating artery identified, likely hypoplastic or absent. Delayed phase: No abnormal intracranial enhancement. Review of the MIP images confirms the above findings CT Brain Perfusion Findings: CBF (<30%) Volume: 34m Perfusion (Tmax>6.0s) volume: 13 ccmL Mismatch Volume: 13 ccmL Infarction Location:Left parietal lobe oligemia. No infarct by standard perfusion criteria. IMPRESSION: 1. Left parietal lobe oligemia with ischemic penumbra of 13 cc. No acute infarct by standard perfusion criteria. 2. Left M2 inferior division distal short segment of occlusion/near occlusion with patent downstream vessel. 3. 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 4. 15 mm nodule in the right lobe of thyroid gland, thyroid ultrasound is recommended on a nonemergent basis. These results were called by telephone at the time of interpretation on 01/24/2018 at 7:33 pm to Dr. DLarae Grooms, who verbally acknowledged these results. Electronically Signed   By: LKristine GarbeM.D.   On: 01/30/2018 19:39   Dg Chest 2 View  Result Date: 02/21/2018 CLINICAL DATA:  Dyspnea. EXAM: CHEST - 2 VIEW COMPARISON:  05/21/2008  FINDINGS: Mild cardiomegaly. Moderate aortic atherosclerosis with uncoiling is noted. No acute pulmonary consolidation or CHF. No effusion or pneumothorax. 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 Degenerative change noted dorsal spine without suspicious osseous abnormality. IMPRESSION: Stable cardiomegaly with aortic atherosclerosis. No active pulmonary disease. Electronically Signed   By: DAshley RoyaltyM.D.   On: 01/26/2018 23:38   Ct Head Wo Contrast  Result Date: 02/02/2018 CLINICAL DATA:  Follow-up of stroke. EXAM: CT HEAD WITHOUT CONTRAST TECHNIQUE: Contiguous axial images were obtained from the base of the skull through the vertex without intravenous contrast. COMPARISON:  02/02/2018 CT head, CTA head, CT perfusion head, and MRI head. FINDINGS: Brain: Small region of cortical hypoattenuation compatible with infarction within the left parietal lobe stable in distribution in comparison with the prior MRI of the head. No new stroke, hemorrhage, or focal mass effect of the brain identified. No extra-axial collection, hydrocephalus, or herniation. Stable nonspecific parenchymal calcification in right periatrial white matter. Stable chronic microvascular ischemic changes and parenchymal volume loss of the brain. Vascular: Calcific atherosclerosis of the carotid siphons. No hyperdense vessel identified. Skull: Normal. Negative for fracture or focal lesion. Sinuses/Orbits: Mild right maxillary sinus mucosal thickening. Normal aeration of mastoid air cells. Bilateral intra-ocular lens replacement. Other: None. IMPRESSION: 1. Small left parietal lobe cortical infarctions stable in distribution in comparison with prior MRI of the head. 2. No new acute intracranial abnormality. 3. Stable background of chronic microvascular ischemic changes and parenchymal volume loss of the brain. Electronically Signed   By: LKristine GarbeM.D.   On: 02/02/2018 15:30   Ct Angio Neck W Or Wo  Contrast  Result Date: 02/08/2018 CLINICAL DATA:  84y/o F; slurred speech, extremity weakness, and confusion, symptoms resolved. EXAM: CT ANGIOGRAPHY HEAD AND NECK CT PERFUSION BRAIN TECHNIQUE: Multidetector CT imaging of the head and neck was performed using the standard protocol during bolus administration of intravenous contrast. Multiplanar CT image reconstructions and MIPs were obtained to evaluate the vascular anatomy. Carotid stenosis measurements (when applicable) are obtained utilizing NASCET criteria, using the distal internal carotid diameter as the denominator. Multiphase CT imaging of the brain was performed following IV bolus contrast injection. Subsequent parametric perfusion maps were calculated using RAPID software. CONTRAST:  104mISOVUE-370 IOPAMIDOL (ISOVUE-370) INJECTION 76% COMPARISON:  01/25/2018 CT head.  07/28/2017 carotid duplex. FINDINGS:  CTA NECK FINDINGS Aortic arch: Standard branching. Imaged portion shows no evidence of aneurysm or dissection. No significant stenosis of the major arch vessel origins. Right carotid system: No evidence of dissection, stenosis (50% or greater) or occlusion. Left carotid system: No evidence of dissection, stenosis (50% or greater) or occlusion. Vertebral arteries: Codominant. No evidence of dissection, stenosis (50% or greater) or occlusion. Skeleton: Mild cervical spondylosis. C6-7 grade 1 anterolisthesis with prominent facet arthropathy. No high-grade bony canal stenosis. Other neck: 15 mm nodule in the right lobe of thyroid (series 6, image 74). Upper chest: Negative. Review of the MIP images confirms the above findings CTA HEAD FINDINGS Anterior circulation: 6 mm anteriorly directed aneurysm arising from the distal horizontal petrous right ICA (series 7, image 211 and series 9, image 59). Left MCA M2 inferior division short segment distal occlusion/near occlusion at the level of the dorsal insula (series 12, image 23), with patent downstream  vessel. No additional large vessel occlusion, aneurysm, or high-grade stenosis. Posterior circulation: Mild left P1 stenosis. No significant proximal stenosis, proximal occlusion, aneurysm, or vascular malformation. Segments of stenosis in the distal PCA circulation bilaterally. Venous sinuses: As permitted by contrast timing, patent. Anatomic variants: Anterior communicating artery and left posterior communicating arteries are present. No right posterior communicating artery identified, likely hypoplastic or absent. Delayed phase: No abnormal intracranial enhancement. Review of the MIP images confirms the above findings CT Brain Perfusion Findings: CBF (<30%) Volume: 59m Perfusion (Tmax>6.0s) volume: 13 ccmL Mismatch Volume: 13 ccmL Infarction Location:Left parietal lobe oligemia. No infarct by standard perfusion criteria. IMPRESSION: 1. Left parietal lobe oligemia with ischemic penumbra of 13 cc. No acute infarct by standard perfusion criteria. 2. Left M2 inferior division distal short segment of occlusion/near occlusion with patent downstream vessel. 3. 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 4. 15 mm nodule in the right lobe of thyroid gland, thyroid ultrasound is recommended on a nonemergent basis. These results were called by telephone at the time of interpretation on 02/06/2018 at 7:33 pm to Dr. DLarae Grooms, who verbally acknowledged these results. Electronically Signed   By: LKristine GarbeM.D.   On: 01/25/2018 19:39   Mr Brain Wo Contrast  Result Date: 02/01/2018 CLINICAL DATA:  Acute onset of aphasia and facial droop at 2:30 p.m. Follow-up LEFT M2 occlusion. History of hypertension. EXAM: MRI HEAD WITHOUT CONTRAST TECHNIQUE: Multiplanar, multiecho pulse sequences of the brain and surrounding structures were obtained without intravenous contrast. COMPARISON:  CT HEAD January 31, 2018 FINDINGS: INTRACRANIAL CONTENTS: Patchy reduced diffusion LEFT  posterior insula and LEFT parietal cortex with low ADC values. Numerous scattered supra infratentorial chronic microhemorrhages. Ventricles and sulci are normal for patient's age. No midline shift, mass effect or masses. Old small RIGHT cerebellar infarct. Numerous supratentorial subcentimeter white matter flared T2 hyperintensities with cystic component. No abnormal extra-axial fluid collections. VASCULAR: Normal major intracranial vascular flow voids present at skull base. SKULL AND UPPER CERVICAL SPINE: No abnormal sellar expansion. Nonspecific low signal calvarium. Craniocervical junction maintained. SINUSES/ORBITS: The mastoid air-cells and included paranasal sinuses are well-aerated.The included ocular globes and orbital contents are non-suspicious. Status post bilateral ocular lens implants. OTHER: None. IMPRESSION: 1. Multifocal acute small LEFT MCA territory nonhemorrhagic infarcts. 2. Numerous chronic microhemorrhages suggesting amyloid angiopathy. 3. Mild-to-moderate chronic small vessel ischemic changes. Electronically Signed   By: CElon AlasM.D.   On: 02/01/2018 00:27   Ct Abdomen Pelvis W Contrast  Result Date: 02/04/2018 CLINICAL DATA:  84y/o  F; nausea, vomiting, and abdominal pain with leukocytosis. EXAM: CT ABDOMEN AND PELVIS WITH CONTRAST TECHNIQUE: Multidetector CT imaging of the abdomen and pelvis was performed using the standard protocol following bolus administration of intravenous contrast. CONTRAST:  167m OMNIPAQUE IOHEXOL 300 MG/ML  SOLN COMPARISON:  None. FINDINGS: Lower chest: Small bilateral pleural effusions. Small hiatal hernia and mild fluid-filled esophageal distention. Two left breast masses measuring 4.0 cm and 2.8 cm (series 3, image 8 and 10). 8 mm nodule in the dependent right lower lobe (series 4, image 10). Hepatobiliary: No focal liver abnormality is seen. No gallstones, gallbladder wall thickening, or biliary dilatation. Pancreas: Unremarkable. No pancreatic  ductal dilatation or surrounding inflammatory changes. Spleen: Normal in size without focal abnormality. Adrenals/Urinary Tract: Atrophic left kidney. Masslike distention of the left renal pelvis measuring up to 2.2 cm. Widening of the left proximal ureter. Multiple small right kidney peripelvic cysts. No additional right kidney focal lesion identified. Normal adrenal glands. Normal right ureter. Normal bladder. Stomach/Bowel: Diffuse distention of the stomach and small bowel tapering to a collapsed terminal ileum. Extensive sigmoid diverticulosis, no findings of acute diverticulitis. Appendix not identified. Vascular/Lymphatic: Aortic atherosclerosis. No enlarged abdominal or pelvic lymph nodes. Reproductive: Status post hysterectomy. No adnexal masses. Other: Small volume of ascites and mild mesenteric edema. Musculoskeletal: No fracture is seen. Multilevel degenerative changes of the spine. IMPRESSION: 1. Diffuse distention of small bowel tapering to a collapsed terminal ileum. Findings suggest enteritis or partial obstruction. 2. Small volume of ascites and mesenteric edema, possibly reactive inflammation or third-spacing. 3. Two left breast masses measuring up to 4.0 cm. Breast malignancy is suspected and clinical correlation is recommended. 4. Atrophic left kidney. Masslike distension of the left renal pelvis with widened proximal ureter. Findings may represent urothelial carcinoma or pyelitis. 5. 8 mm right lower lobe pulmonary nodule. 6. Small bilateral pleural effusions. 7. Small hiatal hernia and dilated fluid-filled lower esophagus. 8. Sigmoid diverticulosis without findings of acute diverticulitis. Electronically Signed   By: LKristine GarbeM.D.   On: 02/04/2018 19:34   Ct Cerebral Perfusion W Contrast  Result Date: 02/01/2018 CLINICAL DATA:  84y/o F; slurred speech, extremity weakness, and confusion, symptoms resolved. EXAM: CT ANGIOGRAPHY HEAD AND NECK CT PERFUSION BRAIN TECHNIQUE:  Multidetector CT imaging of the head and neck was performed using the standard protocol during bolus administration of intravenous contrast. Multiplanar CT image reconstructions and MIPs were obtained to evaluate the vascular anatomy. Carotid stenosis measurements (when applicable) are obtained utilizing NASCET criteria, using the distal internal carotid diameter as the denominator. Multiphase CT imaging of the brain was performed following IV bolus contrast injection. Subsequent parametric perfusion maps were calculated using RAPID software. CONTRAST:  104mISOVUE-370 IOPAMIDOL (ISOVUE-370) INJECTION 76% COMPARISON:  01/28/2018 CT head.  07/28/2017 carotid duplex. FINDINGS: CTA NECK FINDINGS Aortic arch: Standard branching. Imaged portion shows no evidence of aneurysm or dissection. No significant stenosis of the major arch vessel origins. Right carotid system: No evidence of dissection, stenosis (50% or greater) or occlusion. Left carotid system: No evidence of dissection, stenosis (50% or greater) or occlusion. Vertebral arteries: Codominant. No evidence of dissection, stenosis (50% or greater) or occlusion. Skeleton: Mild cervical spondylosis. C6-7 grade 1 anterolisthesis with prominent facet arthropathy. No high-grade bony canal stenosis. Other neck: 15 mm nodule in the right lobe of thyroid (series 6, image 74). Upper chest: Negative. Review of the MIP images confirms the above findings CTA HEAD FINDINGS Anterior circulation: 6 mm anteriorly directed aneurysm arising from the distal horizontal  petrous right ICA (series 7, image 211 and series 9, image 59). Left MCA M2 inferior division short segment distal occlusion/near occlusion at the level of the dorsal insula (series 12, image 23), with patent downstream vessel. No additional large vessel occlusion, aneurysm, or high-grade stenosis. Posterior circulation: Mild left P1 stenosis. No significant proximal stenosis, proximal occlusion, aneurysm, or vascular  malformation. Segments of stenosis in the distal PCA circulation bilaterally. Venous sinuses: As permitted by contrast timing, patent. Anatomic variants: Anterior communicating artery and left posterior communicating arteries are present. No right posterior communicating artery identified, likely hypoplastic or absent. Delayed phase: No abnormal intracranial enhancement. Review of the MIP images confirms the above findings CT Brain Perfusion Findings: CBF (<30%) Volume: 76m Perfusion (Tmax>6.0s) volume: 13 ccmL Mismatch Volume: 13 ccmL Infarction Location:Left parietal lobe oligemia. No infarct by standard perfusion criteria. IMPRESSION: 1. Left parietal lobe oligemia with ischemic penumbra of 13 cc. No acute infarct by standard perfusion criteria. 2. Left M2 inferior division distal short segment of occlusion/near occlusion with patent downstream vessel. 3. 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 4. 15 mm nodule in the right lobe of thyroid gland, thyroid ultrasound is recommended on a nonemergent basis. These results were called by telephone at the time of interpretation on 01/28/2018 at 7:33 pm to Dr. DLarae Grooms, who verbally acknowledged these results. Electronically Signed   By: LKristine GarbeM.D.   On: 02/14/2018 19:39   Dg Chest Port 1 View  Result Date: 02/03/2018 CLINICAL DATA:  Leukocytosis. EXAM: PORTABLE CHEST 1 VIEW COMPARISON:  Chest x-ray dated January 31, 2018. FINDINGS: Stable mild cardiomegaly. Atherosclerotic calcification of the aortic arch. Normal pulmonary vascularity. No focal consolidation, pleural effusion, or pneumothorax. No acute osseous abnormality. IMPRESSION: No active disease. Electronically Signed   By: WTitus DubinM.D.   On: 02/03/2018 08:51   Dg Abd Portable 1v  Result Date: 02/04/2018 CLINICAL DATA:  Nausea vomiting.  Abdominal pain. EXAM: PORTABLE ABDOMEN - 1 VIEW COMPARISON:  None. FINDINGS: Mildly dilated  loops of small bowel in the central to right lower abdomen. Some air and stool is noted in the rectum. No renal or ureteral stones. There are several phleboliths in the pelvis. Status post cholecystectomy. No acute skeletal abnormality. IMPRESSION: 1. Mild dilation of small bowel loops. Findings may reflect an adynamic ileus or a low grade partial obstruction. Electronically Signed   By: DLajean ManesM.D.   On: 02/04/2018 18:47   Ct Head Code Stroke Wo Contrast  Result Date: 01/24/2018 CLINICAL DATA:  Code stroke. 84y/o F; slurred speech, weakness, confusion. EXAM: CT HEAD WITHOUT CONTRAST TECHNIQUE: Contiguous axial images were obtained from the base of the skull through the vertex without intravenous contrast. COMPARISON:  07/27/2017 CT head. FINDINGS: Brain: No evidence of acute infarction, hemorrhage, hydrocephalus, extra-axial collection or mass lesion/mass effect. Partially empty sella turcica. New tiny lucency within the left posterior frontal periventricular white matter compatible with chronic microvascular ischemic change. Additional chronic microvascular ischemic changes and parenchymal volume loss of the brain on prior CT of head are stable. Vascular: Calcific atherosclerosis of carotid siphons. No hyperdense vessel identified. Skull: Normal. Negative for fracture or focal lesion. Sinuses/Orbits: No acute finding. Other: Bilateral intra-ocular lens replacement. ASPECTS ([facility] Early CT Score) - Ganglionic level infarction (caudate, lentiform nuclei, internal capsule, insula, M1-M3 cortex): 7 - Supraganglionic infarction (M4-M6 cortex): 3 Total score (0-10 with 10 being normal): 10 IMPRESSION: 1. No acute intracranial abnormality  identified. 2. ASPECTS is 10. 3. Chronic microvascular ischemic changes and parenchymal volume loss of the brain. These results were called by telephone at the time of interpretation on 01/28/2018 at 5:30 pm to Dr. Larae Grooms , who verbally acknowledged  these results. Electronically Signed   By: Kristine Garbe M.D.   On: 02/07/2018 17:32    ELIGIBLE FOR AVAILABLE RESEARCH PROTOCOL: no  ASSESSMENT: 85 y.o. [place] woman presenting with a left MCA stroke, now on apixaban, incidentally noted to have left breast masses, a possible left renal mass, and leukocytosis  (1) leukocytosis: partly reactive (recent UTI) with elevated polys, but the absolute lymphocyte count of 17.0 is c/w chronic lymphoid leukemia; if this is correct, it requires no intervention as there is no anemia or thrombocytopenia  (a) monitor as outpatient  (2) left breast masses: these are likely breast cancer; note last mammogram in system dates to 2007  (a) CXR and CT abd/pelvis do not demonstrate metastatic disease  (b) order placed for fine-needle biopsy to confirm diagnosis and ascertain whether estrogen dependent (nb did not request core needle Bx because the patient is on apixaban)  (c) if so, start anastrozole and follow as outpatient  (3) left renal pelvis possible mass:  (a) urine cytology requested-- follow-up with urology as appropriate  (4) advanced directives: it will be important for the patient and her children to decide who will be the [facility] as well as the person for the patient's MDs to communicate with  (a) consult to chaplain service placed  PLAN: We spent more than 50% of today's hour-long appointment in counseling and coordination of care regarding the biology of the patient's diagnoses and the specifics of her situation.    As far as CLL I reassured the daughter that even if the WBC were much higher no intervention would be needed unless we documented anemia or thrombocytopenia due to marrow encroachment by CLL. I am not sending flow cytometry at this point but that can be done easily as outpatient if further information needed for clinical management in the future. This requires only follow-up  She very likely does have breast cancer and 70%  of breast cancers are estrogen-dependent. If this is what we are dealing with, I would recommend anastrozole and clinical followup. Would consider surgery only if necessary to avoid uncontrolled chest wall spread.  I am not sure what the left renal lesion represents--this may be what was evaluated at [facility] in 2016 (they obtained a [facility] screen in setting of hematuria). If cytology is positive, urologic referral may be appropriate, versus outpatient monitoring.  Note that the patient's daughter [name] herself has a daughter followed by my partner Dr Rogue Bussing. They request a referral to Dr Rogue Bussing at discharge. Accordingly I am copying him on this note and not making a follow-up appointment for Ms Sobieski in my office.  Will follow with you while in the hospital. Please let me know if I can be of further help.     Chauncey Cruel, MD   02/05/2018 3:49 PM Medical Oncology and Hematology [facility] [address] [place], [place] [address] Tel. [phone]    Fax. [phone]

## 2018-02-05 NOTE — Progress Notes (Signed)
  Speech Language Pathology Treatment: Cognitive-Linquistic  Patient Details Name: Kaitlyn Bauer MRN: [id number] DOB: August 20, 1921 Today's Date: 02/05/2018 Time: 1000-1015 SLP Time Calculation (min) (ACUTE ONLY): 15 min  Assessment / Plan / Recommendation Clinical Impression  Skilled treatment session focused on communication goals. SLP facilitated session by providing sentence completion and phonemic cues to communicate orientation information. Pt with increased frustration as she is very self-aware but is not able to self-correct. Time spent educating pt on aphasia and strategies for word finding. Pt's ability to comprehend information is also hindered by [facility] and no hearing aids.    HPI HPI: [name] is a 84 y.o. female with history of HTN and AF not on AC presenting to [facility] with aphasia and facial droop. famiy refused tPA. Improved upon arrival to [facility] so IR not indicated. Multiple left MCA infarcts embolic secondary to known atrial fibrillation      SLP Plan  Continue with current plan of care       Recommendations                   Oral Care Recommendations: Oral care BID Follow up Recommendations: Home health SLP;Outpatient SLP;Skilled Nursing facility SLP Visit Diagnosis: Aphasia (R47.01) Plan: Continue with current plan of care       GO                Kaitlyn Bauer 02/05/2018, 12:32 PM

## 2018-02-05 NOTE — Progress Notes (Signed)
Physical Therapy Treatment Patient Details Name: Kaitlyn Bauer MRN: [id number] DOB: [birth year] Today's Date: 02/05/2018    History of Present Illness Kaitlyn Bauer is a 84 y.o. female with history of HTN and AF not on AC presenting to [facility] with aphasia and facial droop. famiy refused tPA. Improved upon arrival to [facility] so IR not indicated. Multiple left MCA infarcts embolic secondary to known atrial fibrillation     PT Comments    [name] doing well this afternoon - motivated to work with PT. PT session focusing on improving safe functional mobility. Performing bed mobility at SUP to Min guard level for general safety with verbal cueing for safety and sequencing to maximize efficiency. Min guard throughout gait with RW for general safety. Does require intermittent verbal cueing for safety with AD but with no LOB or overt instability. Some difficulty with obstacle navigation in room requiring guidance throughout. PT to continue to follow acutely.     Follow Up Recommendations  Home health PT;Supervision/Assistance - 24 hour     Equipment Recommendations  None recommended by PT    Recommendations for Other Services       Precautions / Restrictions Precautions Precautions: Fall Restrictions Weight Bearing Restrictions: No    Mobility  Bed Mobility Overal bed mobility: Needs Assistance Bed Mobility: Supine to Sit     Supine to sit: Min guard     General bed mobility comments: with verbal cueing for sequencing  Transfers Overall transfer level: Needs assistance Equipment used: Rolling walker (2 wheeled) Transfers: Sit to/from Stand Sit to Stand: Min guard         General transfer comment: for safety and immediate standing balance  Ambulation/Gait Ambulation/Gait assistance: Min guard Gait Distance (Feet): 90 Feet Assistive device: Rolling walker (2 wheeled) Gait Pattern/deviations: Step-through pattern;Decreased stride length;Trunk flexed Gait velocity:  decreased   General Gait Details: continued slow pace; verbal cueing for safety with AD; stops and asks for directions at each hallway intersection   Stairs             Wheelchair Mobility    Modified Rankin (Stroke Patients Only)       Balance Overall balance assessment: Needs assistance Sitting-balance support: No upper extremity supported;Feet supported Sitting balance-Leahy Scale: Good     Standing balance support: Bilateral upper extremity supported;During functional activity Standing balance-Leahy Scale: Fair Standing balance comment: able to stand for PT to assist with pulling up brief; B UE on RW - no LOB                            Cognition Arousal/Alertness: Awake/alert Behavior During Therapy: WFL for tasks assessed/performed Overall Cognitive Status: Within Functional Limits for tasks assessed                                        Exercises      General Comments        Pertinent Vitals/Pain Pain Assessment: No/denies pain    Home Living     Available Help at Discharge: Family Type of Home: House              Prior Function            PT Goals (current goals can now be found in the care plan section) Acute Rehab PT Goals Patient Stated Goal: to get over  nausea PT Goal Formulation: With patient Time For Goal Achievement: 02/16/18 Potential to Achieve Goals: Good Progress towards PT goals: Progressing toward goals    Frequency    Min 4X/week      PT Plan Current plan remains appropriate    Co-evaluation              AM-PAC PT "6 Clicks" Daily Activity  Outcome Measure  Difficulty turning over in bed (including adjusting bedclothes, sheets and blankets)?: A Little Difficulty moving from lying on back to sitting on the side of the bed? : A Little Difficulty sitting down on and standing up from a chair with arms (e.g., wheelchair, bedside commode, etc,.)?: Unable Help needed moving to and  from a bed to chair (including a wheelchair)?: A Little Help needed walking in hospital room?: A Little Help needed climbing 3-5 steps with a railing? : A Little 6 Click Score: 16    End of Session Equipment Utilized During Treatment: Gait belt Activity Tolerance: Patient tolerated treatment well Patient left: in chair;with call bell/phone within reach;with chair alarm set;with family/visitor present Nurse Communication: Mobility status PT Visit Diagnosis: Unsteadiness on feet (R26.81);Other abnormalities of gait and mobility (R26.89);Muscle weakness (generalized) (M62.81);History of falling (Z91.81)     Time: 9166-0600 PT Time Calculation (min) (ACUTE ONLY): 26 min  Charges:  $Gait Training: 8-22 mins $Therapeutic Activity: 8-22 mins                    G Codes:       Lanney Gins, PT, DPT 02/05/18 2:17 PM Pager: [phone]

## 2018-02-05 NOTE — Progress Notes (Addendum)
STROKE TEAM PROGRESS NOTE  INTERVAL HISTORY Continues to have some nausea and vomiting, none today. Able to take in some breakfast. Overall, she states she does not feel too good. WBC continue to increase with new finding on CT abd and pelvis. Discussed with pt. Also called and spoke with daughter Sissy Hoff and daughter-in-law Anderson Malta at request of pt. Stroke-wise stable; concerned with medical findings. Will involve IM for next steps. Family concerned about d/c disp as well as medical issues and tests needed and what they would want to pursue.   CBC:  Recent Labs  Lab 02/04/18 0745 02/05/18 0730  WBC 28.2* 31.4*  NEUTROABS 11.0* 11.9*  HGB 13.3 12.5  HCT 40.6 38.3  MCV 94.2 94.6  PLT 242 287    Basic Metabolic Panel:  Recent Labs  Lab 02/04/18 0409 02/05/18 0730  NA 139 140  K 3.7 3.8  CL 101 105  CO2 29 27  GLUCOSE 147* 132*  BUN 24* 25*  CREATININE 1.08* 1.20*  CALCIUM 8.9 8.3*   Lipid Panel:     Component Value Date/Time   CHOL 160 02/01/2018 0243   TRIG 95 02/01/2018 0243   HDL 43 02/01/2018 0243   CHOLHDL 3.7 02/01/2018 0243   VLDL 19 02/01/2018 0243   LDLCALC 98 02/01/2018 0243   HgbA1c:  Lab Results  Component Value Date   HGBA1C 5.9 (H) 02/01/2018   Urine Drug Screen: No results found for: LABOPIA, COCAINSCRNUR, LABBENZ, AMPHETMU, THCU, LABBARB  Alcohol Level No results found for: ETH  IMAGING  Ct Head Wo Contrast 02/02/2018 IMPRESSION:  1. Small left parietal lobe cortical infarctions stable in distribution in comparison with prior MRI of the head.  2. No new acute intracranial abnormality.  3. Stable background of chronic microvascular ischemic changes and parenchymal volume loss of the brain.   Ct Angio Head W Or Wo Contrast Ct Angio Neck W Or Wo Contrast Ct Cerebral Perfusion W Contrast 01/22/2018 IMPRESSION:  1. Left parietal lobe oligemia with ischemic penumbra of 13 cc. No acute infarct by standard perfusion criteria.  2. Left M2  inferior division distal short segment of occlusion/near occlusion with patent downstream vessel.  3. 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  4. 15 mm nodule in the right lobe of thyroid gland, thyroid ultrasound is recommended on a nonemergent basis.   MR Brain WO Contrast 02/01/2018 IMPRESSION:  1. Multifocal acute small LEFT MCA territory nonhemorrhagic infarcts.  2. Numerous chronic microhemorrhages suggesting amyloid angiopathy.  3. Mild-to-moderate chronic small vessel ischemic changes.   Dg Chest Port 1 View 02/03/2018 IMPRESSION:  No active disease.   EEG 02/02/2018 Background: There is a posterior dominant rhythm of 8 Hz which is well-formed.  In addition, there is focal left-sided delta and theta activity which is maximal in the left anterior temporal region (F7, T7).  Sleep is recorded with symmetric structures Photic stimulation: Physiologic driving is not performed EEG Abnormalities: 1) left temporal slow activity Clinical Interpretation: This EEG is consistent with a focal area of cerebral dysfunction centered in the left anterior temporal region. There was no seizure or seizure predisposition recorded on this study. Please note that a normal EEG does not preclude the possibility of epilepsy.   Transthoracic Echocardiogram  02/01/2018 Study Conclusions - Left ventricle: The cavity size was normal. Wall thickness was increased in a pattern of mild LVH. Systolic function was normal. The estimated ejection fraction was in the range of 60% to 65%. -  Aortic valve: There was mild regurgitation. Valve area (VTI): 2.66 cm^2. Valve area (Vmax): 2.98 cm^2. Valve area (Vmean): 2.7 cm^2. Impressions:  Poor acoustic windows limit study.  KUB  02/04/2018 1. Mild dilation of small bowel loops. Findings may reflect an adynamic ileus or a low grade partial obstruction.  CT abdomen/pelvis  02/04/2018 1. Diffuse distention of small bowel  tapering to a collapsed terminal ileum. Findings suggest enteritis or partial obstruction. 2. Small volume of ascites and mesenteric edema, possibly reactive inflammation or third-spacing. 3. Two left breast masses measuring up to 4.0 cm. Breast malignancy is suspected and clinical correlation is recommended. 4. Atrophic left kidney. Masslike distension of the left renal pelvis with widened proximal ureter. Findings may represent urothelial carcinoma or pyelitis. 5. 8 mm right lower lobe pulmonary nodule. 6. Small bilateral pleural effusions. 7. Small hiatal hernia and dilated fluid-filled lower esophagus. 8. Sigmoid diverticulosis without findings of acute diverticulitis.   PHYSICAL EXAM Vitals:   02/05/18 0020 02/05/18 0442 02/05/18 0738 02/05/18 1140  BP: (!) 108/54 (!) 112/52 (!) 121/55 (!) 115/47  Pulse: (!) 109 (!) 102 (!) 102 98  Resp: 18 18 18 18   Temp: 98 F (36.7 C) 98.6 F (37 C) 98.7 F (37.1 C) 98.3 F (36.8 C)  TempSrc:   Oral Oral  SpO2: 92% 95% 92% 98%  Weight:      Height:       Pleasant frail elderly lady, curled in bed on her side, soft spoken. In no acute distress. Afebrile. Head is nontraumatic. Neck is supple without bruit. Cardiac exam no murmur or gallop. Lungs are clear to auscultation. Distal pulses are well felt. Abd rounded but non-tender. (reports recent BM) Neurological Exam ;  Awake, oriented to self, but not to place, month or year. Expressive aphasia, able to follow simple commands, able to repeat simple sentences, can name lipstick but not key. Minimum right lower facial asymmetry when she smiles. diminished registration and recall. Eye movements full without nystagmus. fundi were not visualized. Vision acuity and fields appear normal. Hearing is diminished bilaterally. Palatal movements are normal. Tongue midline. Symmetical strength, tone, reflexes and coordination. Sensation, coordination and gait not tested.   ASSESSMENT/PLAN Kaitlyn Bauer is  a 84 y.o. female with history of HTN and AF not on AC presenting to [facility] with aphasia and facial droop. famiy refused tPA. Improved upon arrival to [facility] so IR not indicated.   Stroke:  Multiple left MCA infarcts embolic secondary to known atrial fibrillation source not on [facility]  Code Stroke CT head No acute stroke. ASPECTS 10.     CTA head & neck L M2 occlusion. R ICA 55mm aneurysm. R thyroid lobe 15 mm nodule  CT perfusion parietal penumbra 13. No acute infarct  MRI  Mult small L MCA infarcts. Small vessel disease.   2D Echo - 60% to 65%. - Poor acoustic windows limit study.  EEG - no seizure  LDL 98  HgbA1c 5.9  Heparin 5000 units sq tid for VTE prophylaxis  Reports she did NOT take aspirin 325 mg daily prior to admission as reported.  Now on eliquis 2.5mg  bid, continue on discharge  Therapy recommendations:  [facility] & SLP recommended. Family concerned they will not be able to provide 24/7 care at discharge. Discussed SNF placement with family. Will involved SW to help determine SNF vs ? Hospice (see below)  Disposition:  pending  Atrial Fibrillation  Home anticoagulation:  none   CHA2DS2-VASc Score = at least  4, ?2 oral anticoagulation recommended  Age in Years:  ?20   +2    Sex:  Female   +1    Hypertension History:  yes   +1     Diabetes Mellitus:  0  Congestive Heart Failure History:  0  Vascular Disease History:  0     Stroke/TIA/Thromboembolism History:  yes   +2 . Only 1 reported fall when she was in a hurry - does not fall otherwise. Agreeable to be careful and slower in her movements . Add Eliquis (apixaban) 2.5mg  bid  Abdominal pain - Abnormal CT abd/pelvis    UA WBC 21-50  UCx negative   CXR neg  On doxycycline at home  Added Macrobid - however developed N/V and abdominal pain  Change macrobid to bactrium - Day # 2 / 3  Significant and diffuse abdominal tenderness on palpation Sunday, improved Moonday  KUB mild dilated small bowel loops.   CT  abdomen/pelvis diffuse distention small bowel tapering to collapsed terminal ileum, small ascites & mesenteric edema, 2 left breast masses, strophic L kidney w/ mass-like distension, RLL pulm nodule, sm B pleural effusion, sm HH and dilated fluid-filled lower esophagus, sigmoid divertiulosis  Will get IM consult to help stroke team direct next appropriate plan of care. Discussed with both dtr and grand-dtr-in-law  Leukocytosis  WBC 15.6->23.5->28.3->19.0 -> 28.2->31.4  (afebrile)   Continue Abx  Urine culture negative  Blood culture neg thus far  Hx ? Leukemia in past that post testing was determined not to be (per dtr)  Hypertension  Stable . Home meds:  norvasc 10, hydralazine 25 tid . Resume home BP meds . Long-term BP goal normotensive  Hyperlipidemia  Home meds:  No statin  LDL 98, goal < 70  Add lipitor 10 (dtr states she likely will not take, does not like to take meds if they make her feel bad)  Continue statin at discharge  Other Active Problems  R thyroid nodule 15 mm, needs OP [place]  Hospital day # [place], MSN, APRN, ANVP-BC, AGPCNP-BC Advanced Practice Stroke Nurse [place] for Schedule & Pager information 02/05/2018 2:25 PM   ATTENDING NOTE: I reviewed above note and agree with the assessment and plan. I have made any additions or clarifications directly to the above note. Pt was seen and examined.   No family available during rounding. Pt initially sleeping but easily arousable. No distress, no more N/V and no tenderness in abdomen. Much improved clinically from yesterday. Still has aphasia though. However, her WBC continue to increase with concerns of CLL and CT A/P showed partial bowel obstruction, possible breast cancer, kidney malignancy. Had IM consult and oncology consult, will continue NPO for now for another day for bowel obstruction. Oncology recommended all outpt work up and follow up.   Neuro stable from stroke  standpoint. Will initiate discharge process. Will discuss with IM regarding bowel obstruction management. Once stable from their standpoint, she can be discharged.   Rosalin Hawking, MD PhD Stroke Neurology 02/05/2018 6:38 PM     To contact Stroke Continuity provider, please refer to [email or web address] After hours, contact General Neurology

## 2018-02-05 NOTE — Care Management Important Message (Signed)
Important Message  Patient Details  Name: Kaitlyn Bauer MRN: [id number] Date of Birth: 1921-10-10   Medicare Important Message Given:  Yes    Orbie Pyo 02/05/2018, 3:07 PM

## 2018-02-06 ENCOUNTER — Inpatient Hospital Stay (HOSPITAL_COMMUNITY): Payer: Medicare HMO

## 2018-02-06 DIAGNOSIS — N183 Chronic kidney disease, stage 3 (moderate): Secondary | ICD-10-CM

## 2018-02-06 DIAGNOSIS — N6320 Unspecified lump in the left breast, unspecified quadrant: Secondary | ICD-10-CM

## 2018-02-06 DIAGNOSIS — R109 Unspecified abdominal pain: Secondary | ICD-10-CM

## 2018-02-06 DIAGNOSIS — I10 Essential (primary) hypertension: Secondary | ICD-10-CM

## 2018-02-06 LAB — CBC WITH DIFFERENTIAL/PLATELET
BASOS ABS: 0 10*3/uL (ref 0.0–0.1)
Basophils Relative: 0 %
EOS ABS: 0.4 10*3/uL (ref 0.0–0.7)
EOS PCT: 2 %
HCT: 35.4 % — ABNORMAL LOW (ref 36.0–46.0)
HEMOGLOBIN: 11.5 g/dL — AB (ref 12.0–15.0)
LYMPHS ABS: 12 10*3/uL — AB (ref 0.7–4.0)
Lymphocytes Relative: 58 %
MCH: 31.2 pg (ref 26.0–34.0)
MCHC: 32.5 g/dL (ref 30.0–36.0)
MCV: 95.9 fL (ref 78.0–100.0)
MONOS PCT: 6 %
Monocytes Absolute: 1.2 10*3/uL — ABNORMAL HIGH (ref 0.1–1.0)
Neutro Abs: 7 10*3/uL (ref 1.7–7.7)
Neutrophils Relative %: 34 %
PLATELETS: 223 10*3/uL (ref 150–400)
RBC: 3.69 MIL/uL — ABNORMAL LOW (ref 3.87–5.11)
RDW: 14.7 % (ref 11.5–15.5)
WBC: 20.6 10*3/uL — AB (ref 4.0–10.5)

## 2018-02-06 LAB — BASIC METABOLIC PANEL
Anion gap: 7 (ref 5–15)
BUN: 26 mg/dL — AB (ref 8–23)
CHLORIDE: 107 mmol/L (ref 98–111)
CO2: 28 mmol/L (ref 22–32)
Calcium: 8.2 mg/dL — ABNORMAL LOW (ref 8.9–10.3)
Creatinine, Ser: 1.2 mg/dL — ABNORMAL HIGH (ref 0.44–1.00)
GFR calc Af Amer: 44 mL/min — ABNORMAL LOW (ref >60)
GFR calc non Af Amer: 38 mL/min — ABNORMAL LOW (ref >60)
Glucose, Bld: 117 mg/dL — ABNORMAL HIGH (ref 70–99)
POTASSIUM: 3.7 mmol/L (ref 3.5–5.1)
SODIUM: 142 mmol/L (ref 135–145)

## 2018-02-06 LAB — GLUCOSE, CAPILLARY
GLUCOSE-CAPILLARY: 110 mg/dL — AB (ref 70–99)
Glucose-Capillary: 113 mg/dL — ABNORMAL HIGH (ref 70–99)

## 2018-02-06 MED ORDER — DOCUSATE SODIUM 100 MG PO CAPS
100.0000 mg | ORAL_CAPSULE | Freq: Two times a day (BID) | ORAL | Status: DC
  Administered 2018-02-06 – 2018-02-07 (×4): 100 mg via ORAL
  Filled 2018-02-06 (×5): qty 1

## 2018-02-06 MED ORDER — POLYETHYLENE GLYCOL 3350 17 G PO PACK
17.0000 g | PACK | Freq: Every day | ORAL | Status: DC
  Administered 2018-02-06 – 2018-02-07 (×2): 17 g via ORAL
  Filled 2018-02-06 (×2): qty 1

## 2018-02-06 NOTE — Progress Notes (Signed)
Physical Therapy Treatment Patient Details Name: Kaitlyn Bauer MRN: [id number] DOB: Oct 14, 1921 Today's Date: 02/06/2018    History of Present Illness Kaitlyn Bauer is a 84 y.o. female with history of HTN and AF not on AC presenting to [facility] with aphasia and facial droop. famiy refused tPA. Improved upon arrival to [facility] so IR not indicated. Multiple left MCA infarcts embolic secondary to known atrial fibrillation     PT Comments    Patient received in bed - some motivated required to participate with PT. Progressing mobility, transfers, and gait very well with reduced assist needed. Does continue to require min guard for general safety but without LOB or overt instability. Progression of gait distance today with good tolerance. Greater awareness of surroundings and obstacles in path. PT recommendations remain appropriate.     Follow Up Recommendations  Home health PT;Supervision/Assistance - 24 hour     Equipment Recommendations  None recommended by PT    Recommendations for Other Services       Precautions / Restrictions Precautions Precautions: Fall Restrictions Weight Bearing Restrictions: No    Mobility  Bed Mobility Overal bed mobility: Modified Independent                Transfers Overall transfer level: Needs assistance Equipment used: Rolling walker (2 wheeled) Transfers: Sit to/from Stand Sit to Stand: Min guard            Ambulation/Gait Ambulation/Gait assistance: Min guard Gait Distance (Feet): 120 Feet Assistive device: Rolling walker (2 wheeled) Gait Pattern/deviations: Step-through pattern;Decreased stride length;Trunk flexed     General Gait Details: improved pace, seemingly more confident with gait today   Stairs             Wheelchair Mobility    Modified Rankin (Stroke Patients Only)       Balance Overall balance assessment: Needs assistance Sitting-balance support: No upper extremity supported;Feet  supported Sitting balance-Leahy Scale: Good     Standing balance support: Bilateral upper extremity supported;During functional activity Standing balance-Leahy Scale: Fair                              Cognition Arousal/Alertness: Awake/alert Behavior During Therapy: WFL for tasks assessed/performed Overall Cognitive Status: Within Functional Limits for tasks assessed                                        Exercises      General Comments        Pertinent Vitals/Pain Pain Assessment: No/denies pain    Home Living                      Prior Function            PT Goals (current goals can now be found in the care plan section) Acute Rehab PT Goals Patient Stated Goal: to go home PT Goal Formulation: With patient Time For Goal Achievement: 02/16/18 Potential to Achieve Goals: Good Progress towards PT goals: Progressing toward goals    Frequency    Min 4X/week      PT Plan Current plan remains appropriate    Co-evaluation              AM-PAC PT "6 Clicks" Daily Activity  Outcome Measure  Difficulty turning over in bed (including adjusting bedclothes, sheets and blankets)?:  A Little Difficulty moving from lying on back to sitting on the side of the bed? : A Little Difficulty sitting down on and standing up from a chair with arms (e.g., wheelchair, bedside commode, etc,.)?: Unable Help needed moving to and from a bed to chair (including a wheelchair)?: A Little Help needed walking in hospital room?: A Little Help needed climbing 3-5 steps with a railing? : A Little 6 Click Score: 16    End of Session Equipment Utilized During Treatment: Gait belt Activity Tolerance: Patient tolerated treatment well Patient left: in bed;with call bell/phone within reach;with bed alarm set Nurse Communication: Mobility status PT Visit Diagnosis: Unsteadiness on feet (R26.81);Other abnormalities of gait and mobility (R26.89);Muscle  weakness (generalized) (M62.81);History of falling (Z91.81)     Time: 5188-4166 PT Time Calculation (min) (ACUTE ONLY): 19 min  Charges:  $Gait Training: 8-22 mins                    G Codes:       Lanney Gins, PT, DPT 02/06/18 3:51 PM Pager: [phone]

## 2018-02-06 NOTE — Progress Notes (Addendum)
STROKE TEAM PROGRESS NOTE  INTERVAL HISTORY Granddaughter-in-law at the bedside. Pt sitting up, trying to take in liquids but having much belching. Overall, feeling better, more awake. Pt really wants to go home. She adamantly does not want SNF placement, even temporary, when discussed.  Fine needle bx of breast cannot be done at [facility]. Can be done at OP breast center. Dr. Virgie Dad partner in [place] will follow up and schedule there.  CBC:  Recent Labs  Lab 02/05/18 0730 02/06/18 0453  WBC 31.4* 20.6*  NEUTROABS 11.9* 7.0  HGB 12.5 11.5*  HCT 38.3 35.4*  MCV 94.6 95.9  PLT 241 384    Basic Metabolic Panel:  Recent Labs  Lab 02/05/18 0730 02/06/18 0453  NA 140 142  K 3.8 3.7  CL 105 107  CO2 27 28  GLUCOSE 132* 117*  BUN 25* 26*  CREATININE 1.20* 1.20*  CALCIUM 8.3* 8.2*   Lipid Panel:     Component Value Date/Time   CHOL 160 02/01/2018 0243   TRIG 95 02/01/2018 0243   HDL 43 02/01/2018 0243   CHOLHDL 3.7 02/01/2018 0243   VLDL 19 02/01/2018 0243   LDLCALC 98 02/01/2018 0243   HgbA1c:  Lab Results  Component Value Date   HGBA1C 5.9 (H) 02/01/2018   Urine Drug Screen: No results found for: LABOPIA, COCAINSCRNUR, LABBENZ, AMPHETMU, THCU, LABBARB  Alcohol Level No results found for: ETH  IMAGING  Ct Head Wo Contrast 02/02/2018 IMPRESSION:  1. Small left parietal lobe cortical infarctions stable in distribution in comparison with prior MRI of the head.  2. No new acute intracranial abnormality.  3. Stable background of chronic microvascular ischemic changes and parenchymal volume loss of the brain.   Ct Angio Head W Or Wo Contrast Ct Angio Neck W Or Wo Contrast Ct Cerebral Perfusion W Contrast 02/02/2018 IMPRESSION:  1. Left parietal lobe oligemia with ischemic penumbra of 13 cc. No acute infarct by standard perfusion criteria.  2. Left M2 inferior division distal short segment of occlusion/near occlusion with patent downstream vessel.  3. 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  4. 15 mm nodule in the right lobe of thyroid gland, thyroid ultrasound is recommended on a nonemergent basis.   MR Brain WO Contrast 02/01/2018 IMPRESSION:  1. Multifocal acute small LEFT MCA territory nonhemorrhagic infarcts.  2. Numerous chronic microhemorrhages suggesting amyloid angiopathy.  3. Mild-to-moderate chronic small vessel ischemic changes.   Dg Chest Port 1 View 02/03/2018 IMPRESSION:  No active disease.   EEG 02/02/2018 Background: There is a posterior dominant rhythm of 8 Hz which is well-formed.  In addition, there is focal left-sided delta and theta activity which is maximal in the left anterior temporal region (F7, T7).  Sleep is recorded with symmetric structures Photic stimulation: Physiologic driving is not performed EEG Abnormalities: 1) left temporal slow activity Clinical Interpretation: This EEG is consistent with a focal area of cerebral dysfunction centered in the left anterior temporal region. There was no seizure or seizure predisposition recorded on this study. Please note that a normal EEG does not preclude the possibility of epilepsy.   Transthoracic Echocardiogram  02/01/2018 Study Conclusions - Left ventricle: The cavity size was normal. Wall thickness was increased in a pattern of mild LVH. Systolic function was normal. The estimated ejection fraction was in the range of 60% to 65%. - Aortic valve: There was mild regurgitation. Valve area (VTI): 2.66 cm^2. Valve area (Vmax): 2.98 cm^2. Valve area (Vmean):  2.7 cm^2. Impressions:  Poor acoustic windows limit study.  KUB  02/04/2018 1. Mild dilation of small bowel loops. Findings may reflect an adynamic ileus or a low grade partial obstruction.  CT abdomen/pelvis  02/04/2018 1. Diffuse distention of small bowel tapering to a collapsed terminal ileum. Findings suggest enteritis or partial obstruction. 2. Small volume of  ascites and mesenteric edema, possibly reactive inflammation or third-spacing. 3. Two left breast masses measuring up to 4.0 cm. Breast malignancy is suspected and clinical correlation is recommended. 4. Atrophic left kidney. Masslike distension of the left renal pelvis with widened proximal ureter. Findings may represent urothelial carcinoma or pyelitis. 5. 8 mm right lower lobe pulmonary nodule. 6. Small bilateral pleural effusions. 7. Small hiatal hernia and dilated fluid-filled lower esophagus. 8. Sigmoid diverticulosis without findings of acute diverticulitis.   PHYSICAL EXAM Vitals:   02/06/18 0032 02/06/18 0333 02/06/18 0820 02/06/18 1208  BP: 125/68 (!) 120/49 (!) 143/61 (!) 151/74  Pulse: 84 93 96 99  Resp: 16 16 16 18   Temp: 97.6 F (36.4 C) 98.8 F (37.1 C) 97.9 F (36.6 C) 97.9 F (36.6 C)  TempSrc: Oral Oral Oral Oral  SpO2: 94% 93% 94% 98%  Weight:      Height:       Pleasant frail elderly lady, sitting up on edge of bed drinking fluids. Frequent belching. no other distress. Afebrile. Head is nontraumatic. Neck is supple without bruit. Cardiac exam no murmur or gallop. Lungs are clear to auscultation. Distal pulses are well felt. Abd soft, non-tender. (had BM last night) Neurological Exam ;  Awake, oriented to self, place, month and year. Expressive aphasia with inconsistent anomia, decreased ability to correctly repeat. Able to follow complex commands. Minimum right lower facial asymmetry when she smiles. Diminished registration. Eye movements full without nystagmus. fundi were not visualized. Vision acuity and fields appear normal. Hearing is diminished bilaterally. Palatal movements are normal. Tongue midline. Symmetical strength, tone, reflexes and coordination. Sensation intact. FMM ok in hands. gait not tested.   ASSESSMENT/PLAN [name] is a 84 y.o. female with history of HTN and AF not on AC presenting to [facility] with aphasia and facial droop. famiy refused  tPA. Improved upon arrival to [facility] so IR not indicated.   Stroke:  Multiple left MCA infarcts embolic secondary to known atrial fibrillation source not on [facility]  Code Stroke CT head No acute stroke. ASPECTS 10.     CTA head & neck L M2 occlusion. R ICA 77mm aneurysm. R thyroid lobe 15 mm nodule  CT perfusion parietal penumbra 13. No acute infarct  MRI  Mult small L MCA infarcts. Small vessel disease.   2D Echo - 60% to 65%. - Poor acoustic windows limit study.  EEG - no seizure  LDL 98  HgbA1c 5.9  Heparin 5000 units sq tid for VTE prophylaxis  Reports she did NOT take aspirin 325 mg daily prior to admission as reported.  Now on eliquis 2.5mg  bid, continue on discharge  Therapy recommendations:  [employer] PT & SLP recommended. Family prefer short-term SNF but pt is opposed.   Disposition:  Pending  Atrial Fibrillation  Home anticoagulation:  none   CHA2DS2-VASc Score = at least 4, ?2 oral anticoagulation recommended  Age in Years:  ?70   +2    Sex:  Female   +1    Hypertension History:  yes   +1     Diabetes Mellitus:  0  Congestive Heart Failure History:  0  Vascular Disease History:  0     Stroke/TIA/Thromboembolism History:  yes   +2 . Only 1 reported fall when she was in a hurry - does not fall otherwise. Agreeable to be careful and slower in her movements . Add Eliquis (apixaban) 2.5mg  bid  Abdominal pain, ileus resolving  UA WBC 21-50  UCx negative   CXR neg  On doxycycline at home  Added Macrobid - however developed N/V and abdominal pain  Change macrobid to bactrium - completed course  Significant and diffuse abdominal tenderness on palpation Sunday, improved Moonday  KUB mild dilated small bowel loops.   CT abdomen/pelvis diffuse distention small bowel tapering to collapsed terminal ileum, small ascites & mesenteric edema, 2 left breast masses, strophic L kidney w/ mass-like distension, RLL pulm nodule, sm B pleural effusion, sm HH and dilated fluid-filled  lower esophagus, sigmoid divertiulosis  IM consulted   Abd xray today with improving ileus  Leukocytosis, most likely secondary to CLL  WBC 15.6->23.5->28.3->19.0 -> 28.2->31.4->20.6  (afebrile)   Completed UTI abx  Urine culture negative  Blood culture neg   Hypertension  Stable . Home meds:  norvasc 10, hydralazine 25 tid . Resume home BP meds . Long-term BP goal normotensive  Hyperlipidemia  Home meds:  No statin  LDL 98, goal < 70  Add lipitor 10 (dtr states she likely will not take, does not like to take meds if they make her feel bad)  Continue statin at discharge  Other Active Problems  R thyroid nodule 15 mm, needs OP [place]  L breast masses, seen by oncology, likely breast cancer. Likely treat w/ anastrozole and clinical d/u. Referred to DR. Brahmanday in [place]. Unable to do fine need bx here. Will schedule as an OP  L renal mass and atopy, can consider OP eval. Possibly related to past workup at [facility] in 2016  Chronic kidney dz stage III, Cr. 1.2  Planning for d/c home tomorrow if can take in POs without nausea/vomiting. Will arrange HH PT, OT, SLP, NA.  Hospital day # [place], MSN, APRN, ANVP-BC, AGPCNP-BC Advanced Practice Stroke Nurse [place] for Schedule & Pager information 02/06/2018 3:38 PM   ATTENDING NOTE: I reviewed above note and agree with the assessment and plan. I have made any additions or clarifications directly to the above note. Pt was seen and examined.   Patient sitting in bed, much more awake alert, expressive aphasia improving, denies any abdominal pain.  Currently on clear liquid diet, so far tolerating well.  Oncology recommendations appreciated.  Agree with outpatient follow-up for further work-up of malignancy evaluation.  PT recommended home health PT, however patient lives alone at home, may consider SNF placement.  Rosalin Hawking, MD PhD Stroke Neurology 02/06/2018 11:55 PM       To  contact Stroke Continuity provider, please refer to [email or web address] After hours, contact General Neurology

## 2018-02-06 NOTE — Progress Notes (Signed)
Spoke w daughter [name] on the phone to discuss home care. [name] is having difficulty arranging the 24 hour supervision recommended by PT. We discussed the level of support that [facility] provides and plan on discussing provider choices tomorrow afternoon.   Sissy Hoff Daughter   [phone]  Marchelle Folks Daughter [phone]

## 2018-02-06 NOTE — Progress Notes (Signed)
Occupational Therapy Re-Evaluation Patient Details Name: Kaitlyn Bauer MRN: [id number] DOB: 1922-04-08 Today's Date: 02/06/2018    History of Present Illness Kaitlyn Bauer is a 84 y.o. female with history of HTN and AF not on AC presenting to [facility] with aphasia and facial droop. famiy refused tPA. Improved upon arrival to [facility] so IR not indicated. Multiple left MCA infarcts embolic secondary to known atrial fibrillation CT of the abdomen and pelvis obtained 07/14 showed two left breast masses and a possible left renal mass. Pending left breast biopsy.    Clinical Impression   Pt initially evaluated and discharged by OT 7/11. Pt re-evaluated this date secondary to change in medical issues and concern with level of assistance available to pt after d/c. PTA, pt was living at home alone, and was independent with ADLs and functional mobility. Pt currently requires min guard with ADLs and functional mobility. Pt demonstrated increased difficulty with expressive communication and appeared frustrated during conversations as she was self-aware of deficits, but unable to self correct. Due to cognitive deficits Due to deficits listed below (see OT problem list), pt would benefit from acute OT to address establish goals to facilitate safe D/C home. Based on pt's current level of functioning, pt would require 24/7 physical assistance to safely d/c home. If family unable to provide 24/7 assist, pt would benefit from SNF follow-up.     Follow Up Recommendations  Supervision/Assistance - 24 hour;No OT follow up    Equipment Recommendations  None recommended by OT    Recommendations for Other Services       Precautions / Restrictions Precautions Precautions: Fall Restrictions Weight Bearing Restrictions: No      Mobility Bed Mobility Overal bed mobility: Modified Independent                Transfers Overall transfer level: Needs assistance Equipment used: Rolling walker (2  wheeled) Transfers: Sit to/from Stand Sit to Stand: Min guard         General transfer comment: for safety and immediate standing balance    Balance Overall balance assessment: Needs assistance Sitting-balance support: No upper extremity supported;Feet supported Sitting balance-Leahy Scale: Good     Standing balance support: Bilateral upper extremity supported;During functional activity Standing balance-Leahy Scale: Fair Standing balance comment: pt able to stand to complete toileting hygiene with minguard assist                           ADL either performed or assessed with clinical judgement   ADL                                         General ADL Comments: Overall at a min guard A/S level for basic ADLs     Vision Patient Visual Report: No change from baseline       Perception     Praxis      Pertinent Vitals/Pain Pain Assessment: No/denies pain     Hand Dominance Right   Extremity/Trunk Assessment Upper Extremity Assessment Upper Extremity Assessment: Generalized weakness   Lower Extremity Assessment Lower Extremity Assessment: Defer to PT evaluation   Cervical / Trunk Assessment Cervical / Trunk Assessment: Normal   Communication Communication Communication: HOH;Expressive difficulties(aphasia )   Cognition Arousal/Alertness: Awake/alert Behavior During Therapy: WFL for tasks assessed/performed Overall Cognitive Status: Difficult to assess(no family/caregiver present to determine  baseline cognition) Area of Impairment: Orientation;Following commands;Problem solving                 Orientation Level: Disoriented to;Place     Following Commands: Follows one step commands with increased time     Problem Solving: Slow processing;Requires verbal cues;Requires tactile cues General Comments: pt demonstrated difficulty with expressive and receptive difficulties;when pt prompted to ambulate toward and sit down on the  chair, pt ambulated to the bed and attempted to sit down;pt self-aware of word finding difficulties but is not able to self correct   General Comments  no family/caregiver present during session    Exercises     Shoulder Instructions      Home Living Family/patient expects to be discharged to:: Private residence Living Arrangements: Alone Available Help at Discharge: Family Type of Home: House Home Access: Stairs to enter [employer] of Steps: 6 Entrance Stairs-Rails: Right;Left Home Layout: Two level;Able to live on main level with bedroom/bathroom;Laundry or work area in Building surveyor of Steps: 13 Alternate Level Stairs-Rails: Can reach both Bathroom Shower/Tub: Teacher, early years/pre: Handicapped height Bathroom Accessibility: Yes   Home Equipment: Environmental consultant - 4 wheels;Cane - single point;Crutches          Prior Functioning/Environment Level of Independence: Independent        Comments: Per pt's chart, pt was independent with ADLs and mostly independent with IADLs. Intermittent use of assistive device for ambulation. Has had one fall in last 12 months. Pt currently does not drive         OT Problem List: Impaired balance (sitting and/or standing);Decreased activity tolerance;Decreased strength;Decreased cognition;Decreased safety awareness      OT Treatment/Interventions:      OT Goals(Current goals can be found in the care plan section) Acute Rehab OT Goals Patient Stated Goal: to go home OT Goal Formulation: With patient Time For Goal Achievement: 02/20/18 Potential to Achieve Goals: Good  OT Frequency:     Barriers to D/C:            Co-evaluation              AM-PAC PT "6 Clicks" Daily Activity     Outcome Measure Help from another person eating meals?: None Help from another person taking care of personal grooming?: A Little Help from another person toileting, which includes using toliet, bedpan,  or urinal?: A Little Help from another person bathing (including washing, rinsing, drying)?: A Little Help from another person to put on and taking off regular upper body clothing?: A Little Help from another person to put on and taking off regular lower body clothing?: A Little 6 Click Score: 19   End of Session Equipment Utilized During Treatment: Gait belt;Rolling walker(none) Nurse Communication: Mobility status  Activity Tolerance: Patient tolerated treatment well Patient left: in chair;with call bell/phone within reach;with chair alarm set  OT Visit Diagnosis: Unsteadiness on feet (R26.81);Other symptoms and signs involving cognitive function;History of falling (Z91.81)                Time: 0272-5366 OT Time Calculation (min): 24 min Charges:    G-Codes:     Dorinda Hill OTS    Dorinda Hill 02/06/2018, 9:21 AM

## 2018-02-06 NOTE — Progress Notes (Signed)
[facility] PROGRESS NOTE  Kaitlyn Bauer PTW:[id number] DOB: [birth year] DOA: 02/04/2018  PCP: Baxter Hire, MD  Brief History/Interval Summary: 84 y.o. female with h/o HTN recently admitted for multifocal acute left MCA territory nonhemorrhagic infarcts.    Patient was admitted to the stroke service.  She subsequently started having nausea vomiting.  CT scan raised concern for small bowel obstruction.  Hospitalist service was consulted.  Reason for Visit: Ileus Versus small bowel obstruction  Subjective/Interval History: Patient pleasantly confused.  Denies any complaints this morning.  ROS: Unable to obtain any information from her at this time.  Objective:  Vital Signs  Vitals:   02/05/18 1554 02/06/18 0032 02/06/18 0333 02/06/18 0820  BP: (!) 119/51 125/68 (!) 120/49 (!) 143/61  Pulse: 94 84 93 96  Resp: 18 16 16 16   Temp: 97.6 F (36.4 C) 97.6 F (36.4 C) 98.8 F (37.1 C) 97.9 F (36.6 C)  TempSrc: Oral Oral Oral Oral  SpO2: 96% 94% 93% 94%  Weight:      Height:       No intake or output data in the 24 hours ending 02/06/18 1051 Filed Weights   01/30/2018 2210 02/01/18 0015  Weight: 67.3 kg (148 lb 5.9 oz) 60.4 kg (133 lb 2.5 oz)    General appearance: alert, cooperative, distracted and no distress Head: Normocephalic, without obvious abnormality, atraumatic Resp: clear to auscultation bilaterally Cardio: regular rate and rhythm, S1, S2 normal, no murmur, click, rub or gallop GI: Abdomen noted to be soft.  Nondistended.  Nontender on palpation.  Bowel sounds present. Extremities: extremities normal, atraumatic, no cyanosis or edema Pulses: 2+ and symmetric   Lab Results:  Data Reviewed: I have personally reviewed following labs and imaging studies  CBC: Recent Labs  Lab 01/30/2018 1712  02/03/18 0655 02/04/18 0409 02/04/18 0745 02/05/18 0730 02/06/18 0453  WBC 13.2*   < > 19.0* 26.3* 28.2* 31.4* 20.6*  NEUTROABS 5.4  --   --   --  11.0*  11.9* 7.0  HGB 13.5   < > 13.1 14.0 13.3 12.5 11.5*  HCT 39.4   < > 40.5 42.5 40.6 38.3 35.4*  MCV 93.0   < > 93.8 93.2 94.2 94.6 95.9  PLT 208   < > 194 239 242 241 223   < > = values in this interval not displayed.    Basic Metabolic Panel: Recent Labs  Lab 02/02/18 1420 02/03/18 0655 02/04/18 0409 02/05/18 0730 02/06/18 0453  NA 139 136 139 140 142  K 3.8 3.9 3.7 3.8 3.7  CL 102 101 101 105 107  CO2 26 27 29 27 28   GLUCOSE 140* 121* 147* 132* 117*  BUN 15 20 24* 25* 26*  CREATININE 1.19* 1.08* 1.08* 1.20* 1.20*  CALCIUM 9.2 9.1 8.9 8.3* 8.2*    GFR: Estimated Creatinine Clearance: 24.4 mL/min (A) (by C-G formula based on SCr of 1.2 mg/dL (H)).  Liver Function Tests: Recent Labs  Lab 02/13/2018 1712  AST 24  ALT 10  ALKPHOS 78  BILITOT 0.6  PROT 6.6  ALBUMIN 3.6     Recent Labs  Lab 02/03/18 0655  AMMONIA 34    Coagulation Profile: Recent Labs  Lab 02/16/2018 1712  INR 1.01    Cardiac Enzymes: Recent Labs  Lab 02/05/2018 1712  TROPONINI <0.03    CBG: Recent Labs  Lab 02/04/18 1631 02/04/18 2109 02/05/18 0607 02/05/18 1109 02/06/18 0619  GLUCAP 149* 157* 140* 135* 110*  Recent Results (from the past 240 hour(s))  MRSA PCR Screening     Status: None   Collection Time: 02/01/18 12:23 AM  Result Value Ref Range Status   MRSA by PCR NEGATIVE NEGATIVE Final    Comment:        The GeneXpert MRSA Assay (FDA approved for NASAL specimens only), is one component of a comprehensive MRSA colonization surveillance program. It is not intended to diagnose MRSA infection nor to guide or monitor treatment for MRSA infections. Performed at [facility], [place] [address] [place], [place] [address]   Culture, Urine     Status: None   Collection Time: 02/02/18  5:48 PM  Result Value Ref Range Status   Specimen Description URINE, CLEAN CATCH  Final   Special Requests NONE  Final   Culture   Final    NO GROWTH Performed at [facility], [place] [address], [place], [place] [address]    Report Status 02/03/2018 FINAL  Final  Culture, blood (Routine X 2) w Reflex to ID Panel     Status: None (Preliminary result)   Collection Time: 02/04/18  8:40 AM  Result Value Ref Range Status   Specimen Description BLOOD RIGHT HAND  Final   Special Requests   Final    BOTTLES DRAWN AEROBIC AND ANAEROBIC Blood Culture adequate volume   Culture   Final    NO GROWTH 1 DAY Performed at [facility], [place] [address], [place], [place] [address]    Report Status PENDING  Incomplete  Culture, blood (Routine X 2) w Reflex to ID Panel     Status: None (Preliminary result)   Collection Time: 02/04/18  8:41 AM  Result Value Ref Range Status   Specimen Description BLOOD RIGHT HAND  Final   Special Requests   Final    BOTTLES DRAWN AEROBIC AND ANAEROBIC Blood Culture adequate volume   Culture   Final    NO GROWTH 1 DAY Performed at [facility], [place] [address] [place],  [address]    Report Status PENDING  Incomplete      Radiology Studies: Ct Abdomen Pelvis W Contrast  Result Date: 02/04/2018 CLINICAL DATA:  84 y/o F; nausea, vomiting, and abdominal pain with leukocytosis. EXAM: CT ABDOMEN AND PELVIS WITH CONTRAST TECHNIQUE: Multidetector CT imaging of the abdomen and pelvis was performed using the standard protocol following bolus administration of intravenous contrast. CONTRAST:  14mL OMNIPAQUE IOHEXOL 300 MG/ML  SOLN COMPARISON:  None. FINDINGS: Lower chest: Small bilateral pleural effusions. Small hiatal hernia and mild fluid-filled esophageal distention. Two left breast masses measuring 4.0 cm and 2.8 cm (series 3, image 8 and 10). 8 mm nodule in the dependent right lower lobe (series 4, image 10). Hepatobiliary: No focal liver abnormality is seen. No gallstones, gallbladder wall thickening, or biliary dilatation. Pancreas: Unremarkable. No pancreatic ductal dilatation or surrounding inflammatory changes. Spleen:  Normal in size without focal abnormality. Adrenals/Urinary Tract: Atrophic left kidney. Masslike distention of the left renal pelvis measuring up to 2.2 cm. Widening of the left proximal ureter. Multiple small right kidney peripelvic cysts. No additional right kidney focal lesion identified. Normal adrenal glands. Normal right ureter. Normal bladder. Stomach/Bowel: Diffuse distention of the stomach and small bowel tapering to a collapsed terminal ileum. Extensive sigmoid diverticulosis, no findings of acute diverticulitis. Appendix not identified. Vascular/Lymphatic: Aortic atherosclerosis. No enlarged abdominal or pelvic lymph nodes. Reproductive: Status post hysterectomy. No adnexal masses. Other: Small volume of  ascites and mild mesenteric edema. Musculoskeletal: No fracture is seen. Multilevel degenerative changes of the spine. IMPRESSION: 1. Diffuse distention of small bowel tapering to a collapsed terminal ileum. Findings suggest enteritis or partial obstruction. 2. Small volume of ascites and mesenteric edema, possibly reactive inflammation or third-spacing. 3. Two left breast masses measuring up to 4.0 cm. Breast malignancy is suspected and clinical correlation is recommended. 4. Atrophic left kidney. Masslike distension of the left renal pelvis with widened proximal ureter. Findings may represent urothelial carcinoma or pyelitis. 5. 8 mm right lower lobe pulmonary nodule. 6. Small bilateral pleural effusions. 7. Small hiatal hernia and dilated fluid-filled lower esophagus. 8. Sigmoid diverticulosis without findings of acute diverticulitis. Electronically Signed   By: Kristine Garbe M.D.   On: 02/04/2018 19:34   Dg Abd 2 Views  Result Date: 02/06/2018 CLINICAL DATA:  Ileus. EXAM: ABDOMEN - 2 VIEW COMPARISON:  Radiograph dated 02/04/2018 and CT scan dated 02/04/2018 FINDINGS: Interval decreased small bowel distention since the prior study. There remain a couple of slightly prominent small bowel  loops in the mid abdomen. Colon is not distended. No visible free air. No acute bone abnormality. IMPRESSION: Improving small bowel distention consistent with improving ileus or resolving small bowel obstruction. Electronically Signed   By: Lorriane Shire M.D.   On: 02/06/2018 10:31   Dg Abd Portable 1v  Result Date: 02/04/2018 CLINICAL DATA:  Nausea vomiting.  Abdominal pain. EXAM: PORTABLE ABDOMEN - 1 VIEW COMPARISON:  None. FINDINGS: Mildly dilated loops of small bowel in the central to right lower abdomen. Some air and stool is noted in the rectum. No renal or ureteral stones. There are several phleboliths in the pelvis. Status post cholecystectomy. No acute skeletal abnormality. IMPRESSION: 1. Mild dilation of small bowel loops. Findings may reflect an adynamic ileus or a low grade partial obstruction. Electronically Signed   By: Lajean Manes M.D.   On: 02/04/2018 18:47     Medications:  Scheduled: . amLODipine  10 mg Oral Daily  . apixaban  2.5 mg Oral BID  . atorvastatin  10 mg Oral q1800  . docusate sodium  100 mg Oral BID  . doxycycline  100 mg Oral BID  . hydrALAZINE  25 mg Oral TID  . multivitamin with minerals  1 tablet Oral QAC supper  . polyethylene glycol  17 g Oral Daily  . sulfamethoxazole-trimethoprim  1 tablet Oral Q12H   Continuous: . sodium chloride 50 mL/hr at 02/04/18 1800  . sodium chloride Stopped (02/01/18 0843)   ENI:DPOEUMPNTIRWE **OR** acetaminophen (TYLENOL) oral liquid 160 mg/5 mL **OR** acetaminophen, ondansetron (ZOFRAN) IV, senna-docusate  Assessment/Plan:    Ileus CT scan raised concern for ileus versus small bowel obstruction.  Patient was changed to n.p.o. status.  She was given antiemetics.  NG tube was not placed.  It appears the patient has improved in the last 24 hours.  It also appears that patient had bowel movements yesterday.  Abdominal films repeated this morning and shows improvement in the ileus.  We will place her on stool softeners and  laxatives.  Mobilize.  Out of bed to chair.  Okay to try clear liquids today. If she is able to tolerate clear liquid then diet can be advanced gradually to soft.  Acute ischemic stroke Management per neurology.  Noted to be on apixaban.  Also on statin.  Essential hypertension Blood pressure reasonably well controlled.  Continue to monitor.  Left breast masses Seen by oncology.  Biopsy is being considered.  Defer to oncology at this time.  Leukocytosis, most likely secondary to CLL Mostly chronic.  No clear evidence for infection.  Patient noted to be on doxycycline and Bactrim.  Cultures were negative.  Chest x-ray did not show any infiltrates.  No clear reason to continue these antibiotics.  Looks like the Bactrim course has been completed.  Should also be able to discontinue doxycycline.  Further management of CLL per oncology.  Left renal mass and atrophy Outpatient evaluation by urology could be considered.  Hyperglycemia HbA1c 5.9.  Outpatient monitoring.  Chronic kidney disease stage III Stable.  Right thyroid nodule TSH 2.6.  Needs outpatient work-up.  Noted to be DNR status.   Thank you for this consult.  We will continue to follow the patient while she is in the hospital.     LOS: 6 days   [facility] Pager [phone] 02/06/2018, 10:51 AM  If 7PM-7AM, please contact night-coverage at [email or web address], password [facility]

## 2018-02-06 NOTE — Progress Notes (Signed)
  Speech Language Pathology Treatment: Cognitive-Linquistic  Patient Details Name: Kaitlyn Bauer MRN: [id number] DOB: [birth year] Today's Date: 02/06/2018 Time: 1150-1203 SLP Time Calculation (min) (ACUTE ONLY): 13 min  Assessment / Plan / Recommendation Clinical Impression  Skilled treatment session focused on communication. Pt demonstrated frustration with expressive inability and SLP provided support and education. SLP further facilitated session by providing Mod A sentence complete and semantic cues to label objects within room (cup, washcloth etc). Pt able to imitate phrase level functional description of object. Continue per current plan of care.    HPI HPI: [name] is a 84 y.o. female with history of HTN and AF not on AC presenting to [facility] with aphasia and facial droop. famiy refused tPA. Improved upon arrival to [facility] so IR not indicated. Multiple left MCA infarcts embolic secondary to known atrial fibrillation      SLP Plan  Continue with current plan of care       Recommendations                   Oral Care Recommendations: Oral care BID Follow up Recommendations: Skilled Nursing facility SLP Visit Diagnosis: Aphasia (R47.01) Plan: Continue with current plan of care       GO                Tahiry Spicer 02/06/2018, 12:04 PM

## 2018-02-06 NOTE — Progress Notes (Signed)
OT Note Addendum for Charges    02/06/18 0935  OT General Charges  $OT Visit 1 Visit  OT Evaluation  $OT Re-eval 1 Re-eval  OT Treatments  $Self Care/Home Management  8-22 mins   Booker Bhatnagar A. Ulice Brilliant, M.S., OTR/L Acute Rehab Department: [phone]

## 2018-02-07 ENCOUNTER — Inpatient Hospital Stay (HOSPITAL_COMMUNITY): Payer: Medicare HMO

## 2018-02-07 ENCOUNTER — Encounter: Payer: Self-pay | Admitting: Oncology

## 2018-02-07 DIAGNOSIS — K567 Ileus, unspecified: Secondary | ICD-10-CM

## 2018-02-07 DIAGNOSIS — R1084 Generalized abdominal pain: Secondary | ICD-10-CM

## 2018-02-07 DIAGNOSIS — K560 Paralytic ileus: Secondary | ICD-10-CM

## 2018-02-07 DIAGNOSIS — E785 Hyperlipidemia, unspecified: Secondary | ICD-10-CM

## 2018-02-07 DIAGNOSIS — I4891 Unspecified atrial fibrillation: Secondary | ICD-10-CM

## 2018-02-07 DIAGNOSIS — K56609 Unspecified intestinal obstruction, unspecified as to partial versus complete obstruction: Secondary | ICD-10-CM

## 2018-02-07 LAB — CBC WITH DIFFERENTIAL/PLATELET
Basophils Absolute: 0 10*3/uL (ref 0.0–0.1)
Basophils Relative: 0 %
EOS ABS: 0.5 10*3/uL (ref 0.0–0.7)
Eosinophils Relative: 3 %
HEMATOCRIT: 34.2 % — AB (ref 36.0–46.0)
Hemoglobin: 11.2 g/dL — ABNORMAL LOW (ref 12.0–15.0)
LYMPHS ABS: 8.7 10*3/uL — AB (ref 0.7–4.0)
Lymphocytes Relative: 53 %
MCH: 31.3 pg (ref 26.0–34.0)
MCHC: 32.7 g/dL (ref 30.0–36.0)
MCV: 95.5 fL (ref 78.0–100.0)
Monocytes Absolute: 1.1 10*3/uL — ABNORMAL HIGH (ref 0.1–1.0)
Monocytes Relative: 7 %
NEUTROS PCT: 37 %
Neutro Abs: 6.1 10*3/uL (ref 1.7–7.7)
PLATELETS: 186 10*3/uL (ref 150–400)
RBC: 3.58 MIL/uL — AB (ref 3.87–5.11)
RDW: 14.6 % (ref 11.5–15.5)
WBC: 16.4 10*3/uL — AB (ref 4.0–10.5)

## 2018-02-07 LAB — BASIC METABOLIC PANEL
Anion gap: 5 (ref 5–15)
BUN: 23 mg/dL (ref 8–23)
CALCIUM: 8.3 mg/dL — AB (ref 8.9–10.3)
CO2: 25 mmol/L (ref 22–32)
CREATININE: 1 mg/dL (ref 0.44–1.00)
Chloride: 109 mmol/L (ref 98–111)
GFR, EST AFRICAN AMERICAN: 55 mL/min — AB (ref >60)
GFR, EST NON AFRICAN AMERICAN: 47 mL/min — AB (ref >60)
Glucose, Bld: 103 mg/dL — ABNORMAL HIGH (ref 70–99)
Potassium: 3.7 mmol/L (ref 3.5–5.1)
SODIUM: 139 mmol/L (ref 135–145)

## 2018-02-07 LAB — GLUCOSE, CAPILLARY
GLUCOSE-CAPILLARY: 94 mg/dL (ref 70–99)
Glucose-Capillary: 108 mg/dL — ABNORMAL HIGH (ref 70–99)
Glucose-Capillary: 123 mg/dL — ABNORMAL HIGH (ref 70–99)
Glucose-Capillary: 130 mg/dL — ABNORMAL HIGH (ref 70–99)
Glucose-Capillary: 94 mg/dL (ref 70–99)

## 2018-02-07 MED ORDER — BISACODYL 10 MG RE SUPP
10.0000 mg | Freq: Every day | RECTAL | Status: DC | PRN
  Administered 2018-02-07: 10 mg via RECTAL
  Filled 2018-02-07: qty 1

## 2018-02-07 NOTE — Plan of Care (Signed)
Kaitlyn Bauer has marked expressive aphasia which causes her significant frustration.  She became tearful this morning trying to describe something she wanted.  Coupled with her stroke deficits, she still has not had a bowel movement of note which concerns her family.  There is some disagreement whether the patient will be able to have 24 hour care after discharge.  She is receptive to rehab.  Nursing POC:  Encourage bowel motility with miralax, colace and dulcolax.  Support patient in her efforts to communicate.  Educate and keep family informed.  Maximize safety by using bed alarms and assistance to [facility].  Prepare for discharge to home or rehab.

## 2018-02-07 NOTE — Progress Notes (Signed)
Patient's left arm is is swollen due to a previous IV site that infiltrated. New iv placed and heat pack applied. Patient educated to call if pain occurs or if swelling increases. Will continue to monitor

## 2018-02-07 NOTE — Social Work (Signed)
CSW acknowledging consult, discussed case with RN Case Manager Debbie, at current time pt ambulating 180 ft and requiring min assist with transfers. Aware pt lives at home, however [facility] will likely not cover a SNF stay at current functional status. RN Case Manager is arranging home health care, family working on 24/7 assistance.   CSW signing off. Please consult if any additional needs arise.  Alexander Mt, [place] Work [phone]

## 2018-02-07 NOTE — Progress Notes (Signed)
Physical Therapy Treatment Patient Details Name: Kaitlyn Bauer MRN: [id number] DOB: 08-25-1921 Today's Date: 02/07/2018    History of Present Illness Kaitlyn Bauer is a 84 y.o. female with history of HTN and AF not on AC presenting to [facility] with aphasia and facial droop. famiy refused tPA. Improved upon arrival to [facility] so IR not indicated. Multiple left MCA infarcts embolic secondary to known atrial fibrillation     PT Comments    Patient OOB upon arrival and agreeable to participate in therapy. Pt requires supervision/min guard A for OOB mobility with use of RW. Current plan remains appropriate.    Follow Up Recommendations  Home health PT;Supervision/Assistance - 24 hour     Equipment Recommendations  None recommended by PT    Recommendations for Other Services       Precautions / Restrictions Precautions Precautions: Fall Restrictions Weight Bearing Restrictions: No    Mobility  Bed Mobility               General bed mobility comments: pt sitting on BSC upon arrival  Transfers Overall transfer level: Needs assistance Equipment used: Rolling walker (2 wheeled) Transfers: Sit to/from Stand Sit to Stand: Supervision         General transfer comment: supervision for safety; safe hand placement demonstrated  Ambulation/Gait Ambulation/Gait assistance: Min guard Gait Distance (Feet): 180 Feet Assistive device: Rolling walker (2 wheeled) Gait Pattern/deviations: Step-through pattern;Decreased stride length;Trunk flexed Gait velocity: decreased   General Gait Details: cues for proximity to RW; slow, steady gait    Stairs Stairs: Yes Stairs assistance: Min guard Stair Management: Two rails;Step to pattern;Forwards Number of Stairs: 2 General stair comments: min guard for safety   Wheelchair Mobility    Modified Rankin (Stroke Patients Only)       Balance Overall balance assessment: Needs assistance Sitting-balance support: No upper extremity  supported;Feet supported Sitting balance-Leahy Scale: Good     Standing balance support: Bilateral upper extremity supported;During functional activity Standing balance-Leahy Scale: Fair                              Cognition Arousal/Alertness: Awake/alert Behavior During Therapy: WFL for tasks assessed/performed Overall Cognitive Status: Within Functional Limits for tasks assessed                                        Exercises      General Comments        Pertinent Vitals/Pain Pain Assessment: No/denies pain    Home Living                      Prior Function            PT Goals (current goals can now be found in the care plan section) Acute Rehab PT Goals Patient Stated Goal: to go home PT Goal Formulation: With patient Time For Goal Achievement: 02/16/18 Potential to Achieve Goals: Good Progress towards PT goals: Progressing toward goals    Frequency    Min 4X/week      PT Plan Current plan remains appropriate    Co-evaluation              AM-PAC PT "6 Clicks" Daily Activity  Outcome Measure  Difficulty turning over in bed (including adjusting bedclothes, sheets and blankets)?: A Little Difficulty moving from lying  on back to sitting on the side of the bed? : A Little Difficulty sitting down on and standing up from a chair with arms (e.g., wheelchair, bedside commode, etc,.)?: Unable Help needed moving to and from a bed to chair (including a wheelchair)?: A Little Help needed walking in hospital room?: A Little Help needed climbing 3-5 steps with a railing? : A Little 6 Click Score: 16    End of Session Equipment Utilized During Treatment: Gait belt Activity Tolerance: Patient tolerated treatment well Patient left: with call bell/phone within reach;in chair;with nursing/sitter in room;Other (comment)(pt sitting on BSC and NT present) Nurse Communication: Mobility status PT Visit Diagnosis: Unsteadiness  on feet (R26.81);Other abnormalities of gait and mobility (R26.89);Muscle weakness (generalized) (M62.81);History of falling (Z91.81)     Time: 1045-1100 PT Time Calculation (min) (ACUTE ONLY): 15 min  Charges:  $Gait Training: 8-22 mins                    G Codes:       Earney Navy, PTA Pager: [phone]     Darliss Cheney 02/07/2018, 11:10 AM

## 2018-02-07 NOTE — Care Management Important Message (Signed)
Important Message  Patient Details  Name: Kaitlyn Bauer MRN: [id number] Date of Birth: 1921-10-12   Medicare Important Message Given:  Yes    Orbie Pyo 02/07/2018, 2:52 PM

## 2018-02-07 NOTE — Progress Notes (Addendum)
STROKE TEAM PROGRESS NOTE  INTERVAL HISTORY Daughter Kaitlyn Bauer and Granddaughter-at the bedside - my first day meeting them. Discussed dx, prognosis and treatment.  Patient with limited po intake with breakfast. Complains she needs to move her bowels. Denies BM last pm. Attempting to take in miralax. Will give ducolax. Neuro-wise, remains stable  Spoke to dtr Kaitlyn Bauer later in the day. Will not d/c until she is able to eat. BS continue to improve. Once able to eat, ready for d/c. Does not meet criteria for SNF. Family able to do ALF if they desire.  CBC:  Recent Labs  Lab 02/06/18 0453 02/07/18 0504  WBC 20.6* 16.4*  NEUTROABS 7.0 6.1  HGB 11.5* 11.2*  HCT 35.4* 34.2*  MCV 95.9 95.5  PLT 223 093    Basic Metabolic Panel:  Recent Labs  Lab 02/06/18 0453 02/07/18 0504  NA 142 139  K 3.7 3.7  CL 107 109  CO2 28 25  GLUCOSE 117* 103*  BUN 26* 23  CREATININE 1.20* 1.00  CALCIUM 8.2* 8.3*   Lipid Panel:     Component Value Date/Time   CHOL 160 02/01/2018 0243   TRIG 95 02/01/2018 0243   HDL 43 02/01/2018 0243   CHOLHDL 3.7 02/01/2018 0243   VLDL 19 02/01/2018 0243   LDLCALC 98 02/01/2018 0243   HgbA1c:  Lab Results  Component Value Date   HGBA1C 5.9 (H) 02/01/2018   Urine Drug Screen: No results found for: LABOPIA, COCAINSCRNUR, LABBENZ, AMPHETMU, THCU, LABBARB  Alcohol Level No results found for: ETH  IMAGING  Ct Head Wo Contrast 02/02/2018 IMPRESSION:  1. Small left parietal lobe cortical infarctions stable in distribution in comparison with prior MRI of the head.  2. No new acute intracranial abnormality.  3. Stable background of chronic microvascular ischemic changes and parenchymal volume loss of the brain.   Ct Angio Head W Or Wo Contrast Ct Angio Neck W Or Wo Contrast Ct Cerebral Perfusion W Contrast 02/08/2018 IMPRESSION:  1. Left parietal lobe oligemia with ischemic penumbra of 13 cc. No acute infarct by standard perfusion criteria.  2. Left M2 inferior  division distal short segment of occlusion/near occlusion with patent downstream vessel.  3. 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  4. 15 mm nodule in the right lobe of thyroid gland, thyroid ultrasound is recommended on a nonemergent basis.   MR Brain WO Contrast 02/01/2018 IMPRESSION:  1. Multifocal acute small LEFT MCA territory nonhemorrhagic infarcts.  2. Numerous chronic microhemorrhages suggesting amyloid angiopathy.  3. Mild-to-moderate chronic small vessel ischemic changes.   Dg Chest Port 1 View 02/03/2018 IMPRESSION:  No active disease.   EEG 02/02/2018 Background: There is a posterior dominant rhythm of 8 Hz which is well-formed.  In addition, there is focal left-sided delta and theta activity which is maximal in the left anterior temporal region (F7, T7).  Sleep is recorded with symmetric structures Photic stimulation: Physiologic driving is not performed EEG Abnormalities: 1) left temporal slow activity Clinical Interpretation: This EEG is consistent with a focal area of cerebral dysfunction centered in the left anterior temporal region. There was no seizure or seizure predisposition recorded on this study. Please note that a normal EEG does not preclude the possibility of epilepsy.   Transthoracic Echocardiogram  02/01/2018 Study Conclusions - Left ventricle: The cavity size was normal. Wall thickness was increased in a pattern of mild LVH. Systolic function was normal. The estimated ejection fraction was in the range of  60% to 65%. - Aortic valve: There was mild regurgitation. Valve area (VTI): 2.66 cm^2. Valve area (Vmax): 2.98 cm^2. Valve area (Vmean): 2.7 cm^2. Impressions:  Poor acoustic windows limit study.  KUB  02/04/2018 1. Mild dilation of small bowel loops. Findings may reflect an adynamic ileus or a low grade partial obstruction.  CT abdomen/pelvis  02/04/2018 1. Diffuse distention of small bowel tapering to a  collapsed terminal ileum. Findings suggest enteritis or partial obstruction. 2. Small volume of ascites and mesenteric edema, possibly reactive inflammation or third-spacing. 3. Two left breast masses measuring up to 4.0 cm. Breast malignancy is suspected and clinical correlation is recommended. 4. Atrophic left kidney. Masslike distension of the left renal pelvis with widened proximal ureter. Findings may represent urothelial carcinoma or pyelitis. 5. 8 mm right lower lobe pulmonary nodule. 6. Small bilateral pleural effusions. 7. Small hiatal hernia and dilated fluid-filled lower esophagus. 8. Sigmoid diverticulosis without findings of acute diverticulitis.  No results found.   PHYSICAL EXAM Vitals:   02/07/18 0405 02/07/18 0900 02/07/18 1123 02/07/18 1522  BP: 132/64 126/72  (!) 132/54  Pulse: 80 76 94 88  Resp: 16 20 20 16   Temp: 98.5 F (36.9 C) 97.9 F (36.6 C) 98.4 F (36.9 C) (!) 97.4 F (36.3 C)  TempSrc: Oral Oral  Oral  SpO2: 98% 100% 95% 94%  Weight:      Height:       Pleasant frail elderly lady,lying in bed, drinking fluids. Mild distress - points to ABD. Afebrile. Head is nontraumatic. Neck is supple without bruit. Cardiac exam no murmur or gallop. Lungs are clear to auscultation. Distal pulses are well felt. Abd soft, non-tender. Bowel sounds present all quads, increased from yesterday. No rebound tenderness. Neurological Exam ;  Awake, oriented to self, place, month and year. Expressive aphasia with inconsistent anomia, decreased ability to correctly repeat. Able to follow simple commands, not following more complex commands today (seems purposeful as she became agitated). Can show 2 fingers. Minimum right lower facial asymmetry when she smiles. Diminished registration. Eye movements full without nystagmus. fundi were not visualized. Vision acuity and fields appear normal. Hearing is diminished bilaterally. Palatal movements are normal. Tongue midline. Symmetical  strength, tone, reflexes and coordination. Sensation intact. FMM ok in hands. gait not tested.   ASSESSMENT/PLAN [name] is a 84 y.o. female with history of HTN and AF not on AC presenting to [facility] with aphasia and facial droop. famiy refused tPA. Improved upon arrival to [facility] so IR not indicated.   Stroke:  Multiple left MCA infarcts embolic secondary to known atrial fibrillation source not on [facility]  Code Stroke CT head No acute stroke. ASPECTS 10.     CTA head & neck L M2 occlusion. R ICA 71mm aneurysm. R thyroid lobe 15 mm nodule  CT perfusion parietal penumbra 13. No acute infarct  MRI  Mult small L MCA infarcts. Small vessel disease.   2D Echo - 60% to 65%. - Poor acoustic windows limit study.  EEG - no seizure  LDL 98  HgbA1c 5.9  Heparin 5000 units sq tid for VTE prophylaxis  Reports she did NOT take aspirin 325 mg daily prior to admission as reported.  Now on eliquis 2.5mg  bid, continue on discharge  Therapy recommendations:  [facility] PT & SLP recommended. Have arranged HH PT, OT, ST and NA.   Disposition:  Pending  Atrial Fibrillation  Home anticoagulation:  none   CHA2DS2-VASc Score = at least 4, ?2  oral anticoagulation recommended  Age in Years:  ?68   +2    Sex:  Female   +1    Hypertension History:  yes   +1     Diabetes Mellitus:  0  Congestive Heart Failure History:  0  Vascular Disease History:  0     Stroke/TIA/Thromboembolism History:  yes   +2 . Only 1 reported fall when she was in a hurry - does not fall otherwise. Agreeable to be careful and slower in her movements . Add Eliquis (apixaban) 2.5mg  bid  Abdominal pain, ileus resolving  UA WBC 21-50  UCx negative   CXR neg  On doxycycline at home  Added Macrobid - however developed N/V and abdominal pain. Change macrobid to bactrium - completed course  Significant and diffuse abdominal tenderness on palpation Sunday, improving daily since  KUB mild dilated small bowel loops.   CT  abdomen/pelvis diffuse distention small bowel tapering to collapsed terminal ileum, small ascites & mesenteric edema, 2 left breast masses, strophic L kidney w/ mass-like distension, RLL pulm nodule, sm B pleural effusion, sm HH and dilated fluid-filled lower esophagus, sigmoid divertiulosis  Abd xray with improving ileus  Bowel sounds increasing  Continue clear liquids. Increase diet once liquid intake increases  Plan d/c once able to tolerate food. Discussed w/ dtr Bonnita Nasuti  Leukocytosis, most likely secondary to CLL  WBC 15.6->23.5->28.3->19.0 -> 28.2->31.4->20.6->16.4  (afebrile)   Completed UTI abx  Urine culture negative  Blood culture neg   Hypertension  Stable . Home meds:  norvasc 10, hydralazine 25 tid . Resume home BP meds . Long-term BP goal normotensive  Hyperlipidemia  Home meds:  No statin  LDL 98, goal < 70  Add lipitor 10 (dtr states she likely will not take, does not like to take meds if they make her feel bad)  Continue statin at discharge  Other Active Problems  R thyroid nodule 15 mm, needs OP [place]  L breast masses, seen by oncology, likely breast cancer. Likely treat w/ anastrozole and clinical d/u. Referred to DR. Brahmanday in [place]. Unable to do fine need bx here. Will schedule as an OP. Sent epic message and left VM at the office  L renal mass and atopy, can consider OP eval. Possibly related to past workup at [facility] in 2016  Chronic kidney dz stage III, Cr. 1.2  Again, will plan fof d/c home tomorrow if can take in POs without nausea/vomiting.   Hospital day # [place], MSN, APRN, ANVP-BC, AGPCNP-BC Advanced Practice Stroke Nurse [place] for Schedule & Pager information 02/07/2018 3:25 PM   ATTENDING NOTE: I reviewed above note and agree with the assessment and plan. I have made any additions or clarifications directly to the above note. Pt was seen and examined.   Patient lying in bed, again  complaining of abdominal pain, nausea, but no vomiting yet.  On examination, she has abdominal distention, tenderness on palpation. Air sound with percussion and active bowel sound with ascultation, again concerning for small bowel obstruction. Will do stat KUB.  DC clear liquid diet, keep n.p.o.  If not improving, will request surgery consult.  Rosalin Hawking, MD PhD Stroke Neurology 02/07/2018 3:25 PM       To contact Stroke Continuity provider, please refer to [email or web address] After hours, contact General Neurology

## 2018-02-07 NOTE — Progress Notes (Signed)
Responded to scc to discuss AD with patient.  Family presence asked that I leave form for their review and will notify when ready.  Chaplain available as needed.  Jaclynn Major, [place], [facility], Pager [phone]

## 2018-02-07 NOTE — Progress Notes (Signed)
PROGRESS NOTE    Kaitlyn Bauer  IHK:[id number] DOB: 05-16-1922 DOA: 02/08/2018 PCP: Baxter Hire, MD   Brief Narrative: Patient is a 84 year old female with past medical history of hypertension was admitted for multifocal acute left MCA territory nonhemorrhagic infarcts.  Neurology is the primary team.  She developed nausea and vomiting during the hospitalization.  CT imaging was concerning for small bowel obstruction.  Hospitalist service was consulted for ileus.  Assessment & Plan:   Principal Problem:   Acute ischemic left MCA stroke (HCC) Active Problems:   Essential hypertension   Adynamic ileus (HCC)   Breast mass in female x 2, left   Leukocytosis, like d/t CLL   Left renal atrophy   Hyperglycemia   Chronic kidney disease, stage 3, mod decreased GFR (HCC)   Thyroid nodule, right   Abdominal pain   Atrial fibrillation (HCC)   Hyperlipidemia  Ileus: CT imaging concerning for ileus versus small bowel obstruction.  As per the family, patient had a bowel movement yesterday.  Patient is a very poor historian and very hard on hearing so  reliable  information could not be taken from her. She does not have a bowel movement today.  She still complains of abdominal discomfort.  Abdomen is slightly distended and has mild generalized tenderness.  Bowel sounds are sluggish.  Currently she is on clear liquid diet.  We will continue to recommend clear liquid diet for today and hopefully we can advance the diet tomorrow if she has a bowel movement.  Patient has been passing flatus. Abdominal x-ray done yesterday showed improvement in the hilus.  Continue stool softeners and laxatives.  Continue to mobilize.  Acute ischemic stroke: Management as per neurology.  On Eliquis and statin.  Hypertension: Blood pressure stable.  Continue to monitor.  Left breast mass: Follows with oncology.  Biopsy has been considered.  Leukocytosis:Most likely chronic.History of CLL which could be  contributing.  No evidence of infection.  Cultures are negative.  Chest x-ray did not show any infiltrate.Leucocytosis  is improving.  Left renal mass/atrophy: Outpatient evaluation by urology could be considered.  Hyperglycemia: Hemoglobin was a 5.9.  CKD stage 3: Stable  Right thyroid nodule TSH 2.6.  Needs outpatient work-up.  Deconditioning/debility /stroke: Patient evaluated by  PT and OT and recommended home health physical therapy    DVT prophylaxis: Eliquis Code Status: DNR Family Communication: Daughter present at the bedside Antimicrobials:None  Subjective: Patient seen and examined the bedside this afternoon.  Does not have a bowel movement today.  Passing flatus.  No nausea or vomiting.  Objective: Vitals:   02/06/18 2334 02/07/18 0405 02/07/18 0900 02/07/18 1123  BP: (!) 133/56 132/64 126/72   Pulse: 87 80 76 94  Resp: 14 16 20 20   Temp: 98.5 F (36.9 C) 98.5 F (36.9 C) 97.9 F (36.6 C) 98.4 F (36.9 C)  TempSrc: Oral Oral Oral   SpO2: 95% 98% 100% 95%  Weight:      Height:        Intake/Output Summary (Last 24 hours) at 02/07/2018 1309 Last data filed at 02/07/2018 0400 Gross per 24 hour  Intake 120 ml  Output 250 ml  Net -130 ml   Filed Weights   02/14/2018 2210 02/01/18 0015  Weight: 67.3 kg (148 lb 5.9 oz) 60.4 kg (133 lb 2.5 oz)    Examination:  General exam: Appears calm and comfortable ,Not in distress, early female HEENT:PERRL,Oral mucosa moist, Ear/Nose normal on gross exam,HOH Respiratory system: Bilateral  equal air entry, normal vesicular breath sounds, no wheezes or crackles  Cardiovascular system: S1 & S2 heard, RRR. No JVD, murmurs, rubs, gallops or clicks. No pedal edema. Gastrointestinal system: Abdomen  Mildly distended, soft and mild generalized tendereness. No organomegaly or masses felt. Slow bowel sounds Central nervous system: Alert but not fully oriented. No focal neurological deficits. Extremities: No edema, no clubbing ,no  cyanosis, distal peripheral pulses palpable. Skin: No rashes, lesions or ulcers,no icterus ,no pallor      Data Reviewed: I have personally reviewed following labs and imaging studies  CBC: Recent Labs  Lab 02/15/2018 1712  02/04/18 0409 02/04/18 0745 02/05/18 0730 02/06/18 0453 02/07/18 0504  WBC 13.2*   < > 26.3* 28.2* 31.4* 20.6* 16.4*  NEUTROABS 5.4  --   --  11.0* 11.9* 7.0 6.1  HGB 13.5   < > 14.0 13.3 12.5 11.5* 11.2*  HCT 39.4   < > 42.5 40.6 38.3 35.4* 34.2*  MCV 93.0   < > 93.2 94.2 94.6 95.9 95.5  PLT 208   < > 239 242 241 223 186   < > = values in this interval not displayed.   Basic Metabolic Panel: Recent Labs  Lab 02/03/18 0655 02/04/18 0409 02/05/18 0730 02/06/18 0453 02/07/18 0504  NA 136 139 140 142 139  K 3.9 3.7 3.8 3.7 3.7  CL 101 101 105 107 109  CO2 27 29 27 28 25   GLUCOSE 121* 147* 132* 117* 103*  BUN 20 24* 25* 26* 23  CREATININE 1.08* 1.08* 1.20* 1.20* 1.00  CALCIUM 9.1 8.9 8.3* 8.2* 8.3*   GFR: Estimated Creatinine Clearance: 29.3 mL/min (by C-G formula based on SCr of 1 mg/dL). Liver Function Tests: Recent Labs  Lab 02/20/2018 1712  AST 24  ALT 10  ALKPHOS 78  BILITOT 0.6  PROT 6.6  ALBUMIN 3.6   No results for input(s): LIPASE, AMYLASE in the last 168 hours. Recent Labs  Lab 02/03/18 0655  AMMONIA 34   Coagulation Profile: Recent Labs  Lab 01/28/2018 1712  INR 1.01   Cardiac Enzymes: Recent Labs  Lab 02/20/2018 1712  TROPONINI <0.03   BNP (last 3 results) No results for input(s): PROBNP in the last 8760 hours. HbA1C: No results for input(s): HGBA1C in the last 72 hours. CBG: Recent Labs  Lab 02/05/18 1109 02/06/18 0619 02/06/18 2131 02/07/18 0607 02/07/18 1120  GLUCAP 135* 110* 113* 94 130*   Lipid Profile: No results for input(s): CHOL, HDL, LDLCALC, TRIG, CHOLHDL, LDLDIRECT in the last 72 hours. Thyroid Function Tests: No results for input(s): TSH, T4TOTAL, FREET4, T3FREE, THYROIDAB in the last 72  hours. Anemia Panel: No results for input(s): VITAMINB12, FOLATE, FERRITIN, TIBC, IRON, RETICCTPCT in the last 72 hours. Sepsis Labs: No results for input(s): PROCALCITON, LATICACIDVEN in the last 168 hours.  Recent Results (from the past 240 hour(s))  MRSA PCR Screening     Status: None   Collection Time: 02/01/18 12:23 AM  Result Value Ref Range Status   MRSA by PCR NEGATIVE NEGATIVE Final    Comment:        The GeneXpert MRSA Assay (FDA approved for NASAL specimens only), is one component of a comprehensive MRSA colonization surveillance program. It is not intended to diagnose MRSA infection nor to guide or monitor treatment for MRSA infections. Performed at [facility], [place] [address] [place], [place] [address]   Culture, Urine     Status: None   Collection Time: 02/02/18  5:48  PM  Result Value Ref Range Status   Specimen Description URINE, CLEAN CATCH  Final   Special Requests NONE  Final   Culture   Final    NO GROWTH Performed at [facility], [address] [place], [place] [address]    Report Status 02/03/2018 FINAL  Final  Culture, blood (Routine X 2) w Reflex to ID Panel     Status: None (Preliminary result)   Collection Time: 02/04/18  8:40 AM  Result Value Ref Range Status   Specimen Description BLOOD RIGHT HAND  Final   Special Requests   Final    BOTTLES DRAWN AEROBIC AND ANAEROBIC Blood Culture adequate volume   Culture   Final    NO GROWTH 2 DAYS Performed at [facility], [place] [address], [place], [place] [address]    Report Status PENDING  Incomplete  Culture, blood (Routine X 2) w Reflex to ID Panel     Status: None (Preliminary result)   Collection Time: 02/04/18  8:41 AM  Result Value Ref Range Status   Specimen Description BLOOD RIGHT HAND  Final   Special Requests   Final    BOTTLES DRAWN AEROBIC AND ANAEROBIC Blood Culture adequate volume   Culture   Final    NO GROWTH 2 DAYS Performed at [facility], [place]  [address], [place], [place] [address]    Report Status PENDING  Incomplete         Radiology Studies: Dg Abd 2 Views  Result Date: 02/06/2018 CLINICAL DATA:  Ileus. EXAM: ABDOMEN - 2 VIEW COMPARISON:  Radiograph dated 02/04/2018 and CT scan dated 02/04/2018 FINDINGS: Interval decreased small bowel distention since the prior study. There remain a couple of slightly prominent small bowel loops in the mid abdomen. Colon is not distended. No visible free air. No acute bone abnormality. IMPRESSION: Improving small bowel distention consistent with improving ileus or resolving small bowel obstruction. Electronically Signed   By: Lorriane Shire M.D.   On: 02/06/2018 10:31        Scheduled Meds: . amLODipine  10 mg Oral Daily  . apixaban  2.5 mg Oral BID  . atorvastatin  10 mg Oral q1800  . docusate sodium  100 mg Oral BID  . hydrALAZINE  25 mg Oral TID  . multivitamin with minerals  1 tablet Oral QAC supper  . polyethylene glycol  17 g Oral Daily   Continuous Infusions: . sodium chloride 50 mL/hr at 02/06/18 2355  . sodium chloride Stopped (02/01/18 0843)     LOS: 7 days    Time spent: 35 mins.     Shelly Coss, MD [facility] Pager [phone]  If 7PM-7AM, please contact night-coverage [email or web address] Password [email or web address] 02/07/2018, 1:09 PM

## 2018-02-07 NOTE — Care Management Note (Signed)
Case Management Note  Patient Details  Name: Kaitlyn Bauer MRN: [id number] Date of Birth: 25-Oct-1921  Subjective/Objective:              Spoke w patient's daughter. Discussed home care and requirements for SNF. Discussed pt's ambulatory functioning and level of assistance needed for transition to lay/ sit/ stand. Discussed also w CSW that approval from [facility] is not very likely not to cover with her current functional status. Daughter requested DME 3/1, will place order and have delivered to room. Offered resources for private duty care, she declined. Would like to use [facility] for [facility] services. [facility] will transition her to home hospice services as needed.      Action/Plan:   Expected Discharge Date:  02/02/18               Expected Discharge Plan:  [place]  In-House Referral:     Discharge planning Services  CM Consult  Post Acute Care Choice:  Home Health Choice offered to:  Adult Children  DME Arranged:    DME Agency:     HH Arranged:  PT, OT, Speech Therapy, Nurse's Aide HH Agency:  [place]  Status of Service:  Completed, signed off  If discussed at [place] of Stay Meetings, dates discussed:    Additional Comments:  Carles Collet, RN 02/07/2018, 12:40 PM

## 2018-02-08 ENCOUNTER — Inpatient Hospital Stay (HOSPITAL_COMMUNITY): Payer: Medicare HMO

## 2018-02-08 LAB — CBC WITH DIFFERENTIAL/PLATELET
BASOS PCT: 1 %
Basophils Absolute: 0.3 10*3/uL — ABNORMAL HIGH (ref 0.0–0.1)
EOS PCT: 1 %
Eosinophils Absolute: 0.3 10*3/uL (ref 0.0–0.7)
HCT: 39.4 % (ref 36.0–46.0)
Hemoglobin: 13 g/dL (ref 12.0–15.0)
LYMPHS ABS: 19.7 10*3/uL — AB (ref 0.7–4.0)
Lymphocytes Relative: 60 %
MCH: 31.4 pg (ref 26.0–34.0)
MCHC: 33 g/dL (ref 30.0–36.0)
MCV: 95.2 fL (ref 78.0–100.0)
MONO ABS: 1 10*3/uL (ref 0.1–1.0)
Monocytes Relative: 3 %
NEUTROS ABS: 11.5 10*3/uL — AB (ref 1.7–7.7)
NEUTROS PCT: 35 %
PLATELETS: 291 10*3/uL (ref 150–400)
RBC: 4.14 MIL/uL (ref 3.87–5.11)
RDW: 14.7 % (ref 11.5–15.5)
WBC: 32.8 10*3/uL — ABNORMAL HIGH (ref 4.0–10.5)

## 2018-02-08 LAB — GLUCOSE, CAPILLARY
GLUCOSE-CAPILLARY: 132 mg/dL — AB (ref 70–99)
GLUCOSE-CAPILLARY: 120 mg/dL — AB (ref 70–99)
Glucose-Capillary: 120 mg/dL — ABNORMAL HIGH (ref 70–99)
Glucose-Capillary: 115 mg/dL — ABNORMAL HIGH (ref 70–99)
Glucose-Capillary: 119 mg/dL — ABNORMAL HIGH (ref 70–99)

## 2018-02-08 LAB — APTT: aPTT: 48 seconds — ABNORMAL HIGH (ref 24–36)

## 2018-02-08 MED ORDER — LIDOCAINE VISCOUS HCL 2 % MT SOLN
OROMUCOSAL | Status: AC
  Administered 2018-02-08: 16:00:00
  Filled 2018-02-08: qty 15

## 2018-02-08 MED ORDER — LIDOCAINE VISCOUS HCL 2 % MT SOLN
15.0000 mL | Freq: Once | OROMUCOSAL | Status: AC
  Administered 2018-02-08: 15 mL via OROMUCOSAL
  Filled 2018-02-08: qty 15

## 2018-02-08 MED ORDER — HEPARIN (PORCINE) IN NACL 100-0.45 UNIT/ML-% IJ SOLN
900.0000 [IU]/h | INTRAMUSCULAR | Status: DC
  Administered 2018-02-08: 800 [IU]/h via INTRAVENOUS
  Administered 2018-02-09: 900 [IU]/h via INTRAVENOUS
  Filled 2018-02-08 (×2): qty 250

## 2018-02-08 MED ORDER — DIATRIZOATE MEGLUMINE & SODIUM 66-10 % PO SOLN
90.0000 mL | Freq: Once | ORAL | Status: AC
  Administered 2018-02-08: 90 mL via NASOGASTRIC
  Filled 2018-02-08: qty 90

## 2018-02-08 NOTE — Progress Notes (Addendum)
STROKE TEAM PROGRESS NOTE  INTERVAL HISTORY Granddaughter at the bedside. GS PA just saw. Plans NG for bowel decompression followed by abd films. Patient complains of nausea. Vomited ~100cc dark brown fluid while I was in the room (laying on her side). Neuro-wise she is stable. Issues for continued hospitalization related to po intake, abd pain.  CBC:  Recent Labs  Lab 02/07/18 0504 02/08/18 0347  WBC 16.4* 32.8*  NEUTROABS 6.1 11.5*  HGB 11.2* 13.0  HCT 34.2* 39.4  MCV 95.5 95.2  PLT 186 009    Basic Metabolic Panel:  Recent Labs  Lab 02/06/18 0453 02/07/18 0504  NA 142 139  K 3.7 3.7  CL 107 109  CO2 28 25  GLUCOSE 117* 103*  BUN 26* 23  CREATININE 1.20* 1.00  CALCIUM 8.2* 8.3*   IMAGING  Ct Head Wo Contrast 02/02/2018 IMPRESSION:  1. Small left parietal lobe cortical infarctions stable in distribution in comparison with prior MRI of the head.  2. No new acute intracranial abnormality.  3. Stable background of chronic microvascular ischemic changes and parenchymal volume loss of the brain.   Ct Angio Head W Or Wo Contrast Ct Angio Neck W Or Wo Contrast Ct Cerebral Perfusion W Contrast 02/13/2018 IMPRESSION:  1. Left parietal lobe oligemia with ischemic penumbra of 13 cc. No acute infarct by standard perfusion criteria.  2. Left M2 inferior division distal short segment of occlusion/near occlusion with patent downstream vessel.  3. 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  4. 15 mm nodule in the right lobe of thyroid gland, thyroid ultrasound is recommended on a nonemergent basis.   MR Brain WO Contrast 02/01/2018 IMPRESSION:  1. Multifocal acute small LEFT MCA territory nonhemorrhagic infarcts.  2. Numerous chronic microhemorrhages suggesting amyloid angiopathy.  3. Mild-to-moderate chronic small vessel ischemic changes.   Dg Chest Port 1 View 02/03/2018 IMPRESSION:  No active disease.    EEG 02/02/2018 Background: There is a posterior dominant rhythm of 8 Hz which is well-formed.  In addition, there is focal left-sided delta and theta activity which is maximal in the left anterior temporal region (F7, T7).  Sleep is recorded with symmetric structures Photic stimulation: Physiologic driving is not performed EEG Abnormalities: 1) left temporal slow activity Clinical Interpretation: This EEG is consistent with a focal area of cerebral dysfunction centered in the left anterior temporal region. There was no seizure or seizure predisposition recorded on this study. Please note that a normal EEG does not preclude the possibility of epilepsy.   Transthoracic Echocardiogram  02/01/2018 Study Conclusions - Left ventricle: The cavity size was normal. Wall thickness was increased in a pattern of mild LVH. Systolic function was normal. The estimated ejection fraction was in the range of 60% to 65%. - Aortic valve: There was mild regurgitation. Valve area (VTI): 2.66 cm^2. Valve area (Vmax): 2.98 cm^2. Valve area (Vmean): 2.7 cm^2. Impressions:  Poor acoustic windows limit study.  KUB  02/04/2018 1. Mild dilation of small bowel loops. Findings may reflect an adynamic ileus or a low grade partial obstruction.  CT abdomen/pelvis  02/04/2018 1. Diffuse distention of small bowel tapering to a collapsed terminal ileum. Findings suggest enteritis or partial obstruction. 2. Small volume of ascites and mesenteric edema, possibly reactive inflammation or third-spacing. 3. Two left breast masses measuring up to 4.0 cm. Breast malignancy is suspected and clinical correlation is recommended. 4. Atrophic left kidney. Masslike distension of the left renal pelvis with widened proximal ureter.  Findings may represent urothelial carcinoma or pyelitis. 5. 8 mm right lower lobe pulmonary nodule. 6. Small bilateral pleural effusions. 7. Small hiatal hernia and dilated fluid-filled lower esophagus. 8.  Sigmoid diverticulosis without findings of acute diverticulitis.  Dg Abd 2 Views 02/06/2018 Improving small bowel distention consistent with improving ileus or resolving small bowel obstruction.   Dg Abd Portable 1v 02/07/2018 1. Increase in small bowel distension over the last day, currently moderate. The appearance is nonspecific but may well represent small bowel obstruction. The stomach is also somewhat distended.       PHYSICAL EXAM Vitals:   02/07/18 1522 02/07/18 2338 02/08/18 0420 02/08/18 0803  BP: (!) 132/54 (!) 144/63 (!) 140/55 (!) 127/46  Pulse: 88 99 (!) 104 (!) 108  Resp: 16 14 14 18   Temp: (!) 97.4 F (36.3 C) 98.3 F (36.8 C) 98 F (36.7 C) 98.2 F (36.8 C)  TempSrc: Oral Oral Oral   SpO2: 94% 92% 94% 94%  Weight:      Height:       Pleasant frail elderly lady, lying in bed, in distress from nausea. Passing gas, belching. Afebrile. Head is nontraumatic. Neck is supple without bruit. Cardiac exam no murmur or gallop. Lungs are clear to auscultation. Distal pulses are well felt. Abd distended but overall soft, tender. Bowel sounds present all quads.  Vomited 100cc brown fluid during exam. Neurological Exam ;  Awake, oriented to self, place, month and year. Expressive aphasia with inconsistent anomia, decreased ability to correctly repeat. Able to follow commands. Minimum right lower facial asymmetry when she smiles. Diminished registration. Eye movements full without nystagmus. Vision acuity and fields appear normal. Hearing is diminished bilaterally. Palatal movements are normal. Tongue midline. Symmetical strength, tone, reflexes and coordination. Sensation intact. FMM ok in hands. gait not tested.   ASSESSMENT/PLAN Kaitlyn Bauer is a 84 y.o. female with history of HTN and AF not on AC presenting to [facility] with aphasia and facial droop. famiy refused tPA. Improved upon arrival to [facility] so IR not indicated.   Stroke:  Multiple left MCA infarcts embolic secondary  to known atrial fibrillation source not on [facility]  Code Stroke CT head No acute stroke. ASPECTS 10.     CTA head & neck L M2 occlusion. R ICA 25mm aneurysm. R thyroid lobe 15 mm nodule  CT perfusion parietal penumbra 13. No acute infarct  MRI  Mult small L MCA infarcts. Small vessel disease.   2D Echo - 60% to 65%. - Poor acoustic windows limit study.  EEG - no seizure  LDL 98  HgbA1c 5.9  Heparin 5000 units sq tid for VTE prophylaxis  Reports she did NOT take aspirin 325 mg daily prior to admission as reported.  Now on eliquis 2.5mg  bid, continue on discharge  Therapy recommendations:  [place] [facility] S[facility] recommended. Have arranged HH PT, OT, ST and NA. Does not qualify for IP or SNF rehab stay. Family welcome to consider ALF if they desire. SW and CM following  Disposition:  Pending  Atrial Fibrillation  Home anticoagulation:  none   CHA2DS2-VASc Score = at least 4, ?2 oral anticoagulation recommended  Age in Years:  ?66   +2    Sex:  Female   +1    Hypertension History:  yes   +1     Diabetes Mellitus:  0  Congestive Heart Failure History:  0  Vascular Disease History:  0     Stroke/TIA/Thromboembolism History:  yes   +  2 . Only 1 reported fall when she was in a hurry - does not fall otherwise. Agreeable to be careful and slower in her movements . Add Eliquis (apixaban) 2.5mg  bid  Abdominal pain, ileus   UA WBC 21-50, UCx negative. On doxycycline at home. Added Macrobid - however developed N/V and abdominal pain. Change macrobid to bactrium - completed course  CXR neg  Significant and diffuse abdominal tenderness on palpation Sunday, improving daily with increased worsening Thurs am  CT abdomen/pelvis diffuse distention small bowel tapering to collapsed terminal ileum, small ascites & mesenteric edema, 2 left breast masses, strophic L kidney w/ mass-like distension, RLL pulm nodule, sm B pleural effusion, sm HH and dilated fluid-filled lower esophagus, sigmoid  divertiulosis  Initial KUB w/ mild dilated small bowel loops then Abd xray with improving ileus. Xray now with worsening dilated bowel  Bowel sounds increasing  Now NPO.   General surgery consulted. Plan NG for bowel decompression followed by imaging. Hopeful for conservative management  Leukocytosis, most likely secondary to CLL  WBC 15.6->23.5->28.3->19.0 -> 28.2->31.4->20.6->16.4->32.8  (afebrile)   Completed UTI abx  Urine culture negative  Blood culture neg   Hypertension  Stable . Home meds:  norvasc 10, hydralazine 25 tid . Resume home BP meds . Long-term BP goal normotensive  Hyperlipidemia  Home meds:  No statin  LDL 98, goal < 70  Add lipitor 10 (dtr states she likely will not take, does not like to take meds if they make her feel bad)  Continue statin at discharge  Other Active Problems  R thyroid nodule 15 mm, needs OP [place]  L breast masses, seen by oncology, likely breast cancer. Likely treat w/ anastrozole and clinical d/u. Referred to DR. Brahmanday in [place]. Unable to do fine need bx here. Will schedule as an OP. Sent epic message and left VM at the office  L renal mass and atopy, can consider OP eval. Possibly related to past workup at [facility] in 2016  Chronic kidney dz stage III, Cr. 1.0  Stable from stroke standpoint. Will sign off. Available as needed. Follow up in 4 weeks. Order placed.  Hospital day # [place], MSN, APRN, ANVP-BC, AGPCNP-BC Advanced Practice Stroke Nurse [facility] for Schedule & Pager information 02/08/2018 8:24 AM   ATTENDING NOTE: I reviewed above note and agree with the assessment and plan. I have made any additions or clarifications directly to the above note. Pt was seen and examined.   Daughter [name] is at bedside. Pt had NG inserted and now with suctioning. Pt abdominal extension much improved and no tenderness on palpation this am. Pt not in acute distress. Surgery consulted for  SBO vs. Ileus. Agree with change eliquis to heparin IV in preparation of surgery if needed. Continue NPO status. Appreciate Dr. Tawanna Solo to take over the patient care.   Rosalin Hawking, MD PhD Stroke Neurology 02/08/2018 3:21 PM     To contact Stroke Continuity provider, please refer to [email or web address] After hours, contact General Neurology

## 2018-02-08 NOTE — Progress Notes (Signed)
ANTICOAGULATION CONSULT NOTE - Initial Consult  Pharmacy Consult for Heparin Indication: atrial fibrillation and stroke  Allergies  Allergen Reactions  . Penicillins Hives    Has patient had a PCN reaction causing immediate rash, facial/tongue/throat swelling, SOB or lightheadedness with hypotension: Unknown Has patient had a PCN reaction causing severe rash involving mucus membranes or skin necrosis: Unknown Has patient had a PCN reaction that required hospitalization: Unknown Has patient had a PCN reaction occurring within the last 10 years: Unknown If all of the above answers are "NO", then may proceed with Cephalosporin use.     Patient Measurements: Height: 5\' 1"  (154.9 cm) Weight: 133 lb 2.5 oz (60.4 kg) IBW/kg (Calculated) : 47.8 Heparin Dosing Weight: 60 kg  Vital Signs: Temp: 98.2 F (36.8 C) (07/18 0803) Temp Source: Oral (07/18 0420) BP: 127/46 (07/18 0803) Pulse Rate: 108 (07/18 0803)  Labs: Recent Labs    02/06/18 0453 02/07/18 0504 02/08/18 0347  HGB 11.5* 11.2* 13.0  HCT 35.4* 34.2* 39.4  PLT 223 186 291  CREATININE 1.20* 1.00  --     Estimated Creatinine Clearance: 29.3 mL/min (by C-G formula based on SCr of 1 mg/dL).   Medical History: Past Medical History:  Diagnosis Date  . Hypertension     Medications:  Scheduled:  . amLODipine  10 mg Oral Daily  . atorvastatin  10 mg Oral q1800  . diatrizoate meglumine-sodium  90 mL Per NG tube Once  . hydrALAZINE  25 mg Oral TID  . multivitamin with minerals  1 tablet Oral QAC supper    Assessment: Patient is a 84 YO female with a past medical history of HTN and afib not on anticoagulation prior to admission being started on heparin for stroke and afib. Patient was started on apixaban on 7/11 and her last dose was on 7/17 at about 10:30 PM. Apixaban was stopped in anticipation of patient needing surgery for small bowel obstruction.  Goal of Therapy:  Heparin level 0.3-0.5 units/ml aPTT 66-85  seconds Monitor platelets by anticoagulation protocol: Yes   Plan:  Start heparin infusion at 800 units/hr Monitor daily heparin levels Monitor 6 hour and daily aPTT levels until correlated  Continue to monitor H&H and platelets  Jackson Latino, PharmD PGY1 Pharmacy Resident Phone [phone] 02/08/2018     11:57 AM

## 2018-02-08 NOTE — Consult Note (Addendum)
[facility] Surgery Consult Note  Kaitlyn Bauer 23-May-1922  [id number].    Requesting MD: Tawanna Solo Chief Complaint/Reason for Consult: SBO vs ileus HPI:  Patient is a 84 y/o female with PMH significant for HTN and A.fib not on anticoagulation, presented to [facility] 7/10 with sudden onset aphasia and facial droop. TPA declined, CTA showed left MCA M2 occlusion. Internal medicine consulted 7/15 due to concern for UTI and possible ileus. Was improving from an ileus standpoint, but abdominal film last night showed increase in small bowel distention with concern for SBO and some distention of the stomach as well. Patient is aphasic but granddaughter, who is a paramedic, was at the bedside and assisted with history. Patient has been clutching her stomach more and displaying decreased appetite. Has spit up small amounts and occasionally able to complain of nausea.   Past abdominal surgeries include: abdominal hysterectomy, appendectomy, and cholecystectomy.  ROS: Review of Systems  Gastrointestinal: Positive for abdominal pain, nausea and vomiting.  Neurological: Positive for speech change.  All other systems reviewed and are negative.   Family History  Family history unknown: Yes    Past Medical History:  Diagnosis Date  . Hypertension     Past Surgical History:  Procedure Laterality Date  . ABDOMINAL HYSTERECTOMY    . APPENDECTOMY    . CHOLECYSTECTOMY      Social History:  reports that she has quit smoking. She has never used smokeless tobacco. She reports that she does not drink alcohol or use drugs.  Allergies:  Allergies  Allergen Reactions  . Penicillins Hives    Has patient had a PCN reaction causing immediate rash, facial/tongue/throat swelling, SOB or lightheadedness with hypotension: Unknown Has patient had a PCN reaction causing severe rash involving mucus membranes or skin necrosis: Unknown Has patient had a PCN reaction that required hospitalization: Unknown Has  patient had a PCN reaction occurring within the last 10 years: Unknown If all of the above answers are "NO", then may proceed with Cephalosporin use.     Medications Prior to Admission  Medication Sig Dispense Refill  . amLODipine (NORVASC) 10 MG tablet Take 1 tablet (10 mg total) by mouth daily. 30 tablet 0  . aspirin 325 MG EC tablet Take 325 mg by mouth as needed (arthritis).    . Cholecalciferol (VITAMIN D-1000 MAX ST) 1000 units tablet Take 1,000 Units by mouth daily.    Marland Kitchen doxycycline (VIBRAMYCIN) 100 MG capsule Take 1 capsule (100 mg total) by mouth 2 (two) times daily. 20 capsule 0  . hydrALAZINE (APRESOLINE) 25 MG tablet Take 1 tablet (25 mg total) by mouth 3 (three) times daily. 90 tablet 0  . Multiple Vitamin (MULTI-VITAMINS) TABS Take 1 tablet by mouth daily.    . [EXPIRED] nitrofurantoin, macrocrystal-monohydrate, (MACROBID) 100 MG capsule Take 100 mg by mouth 2 (two) times daily.      Blood pressure (!) 127/46, pulse (!) 108, temperature 98.2 F (36.8 C), resp. rate 18, height _0  (1.549 m), weight 133 lb 2.5 oz (60.4 kg), SpO2 94 %. Physical Exam: Physical Exam  Constitutional: She appears well-developed and well-nourished. She is sleeping and cooperative. She is easily aroused. She appears ill.  HENT:  Head: Normocephalic and atraumatic.  Right Ear: External ear normal.  Left Ear: External ear normal.  Nose: Nose normal.  Mouth/Throat: Oropharynx is clear and moist and mucous membranes are normal.  Eyes: Conjunctivae and lids are normal. No scleral icterus.  Neck: Normal range of motion. Neck supple. No tracheal  deviation present.  Cardiovascular: Normal rate and regular rhythm.  Pulses:      Radial pulses are 2+ on the right side, and 2+ on the left side.       Dorsalis pedis pulses are 2+ on the right side, and 2+ on the left side.  Pulmonary/Chest: Effort normal and breath sounds normal.  Abdominal: Soft. She exhibits distension (mild). Bowel sounds are  decreased. There is no hepatosplenomegaly. There is no tenderness. There is no rigidity, no rebound and no guarding.  Musculoskeletal:  No gross deformities of bilateral upper and lower extremities. Moving LEs well bilaterally.   Neurological: She is easily aroused.  aphasia  Skin: Skin is warm, dry and intact.  Psychiatric:  Pt appears frustrated, as expected    Results for orders placed or performed during the hospital encounter of 02/19/2018 (from the past 48 hour(s))  Glucose, capillary     Status: Abnormal   Collection Time: 02/06/18  9:31 PM  Result Value Ref Range   Glucose-Capillary 113 (H) 70 - 99 mg/dL   Comment 1 Notify RN    Comment 2 Document in Chart   Basic metabolic panel     Status: Abnormal   Collection Time: 02/07/18  5:04 AM  Result Value Ref Range   Sodium 139 135 - 145 mmol/L   Potassium 3.7 3.5 - 5.1 mmol/L   Chloride 109 98 - 111 mmol/L    Comment: Please note change in reference range.   CO2 25 22 - 32 mmol/L   Glucose, Bld 103 (H) 70 - 99 mg/dL    Comment: Please note change in reference range.   BUN 23 8 - 23 mg/dL    Comment: Please note change in reference range.   Creatinine, Ser 1.00 0.44 - 1.00 mg/dL   Calcium 8.3 (L) 8.9 - 10.3 mg/dL   GFR calc non Af Amer 47 (L) >60 mL/min   GFR calc Af Amer 55 (L) >60 mL/min    Comment: (NOTE) The eGFR has been calculated using the CKD EPI equation. This calculation has not been validated in all clinical situations. eGFR's persistently <60 mL/min signify possible Chronic Kidney Disease.    Anion gap 5 5 - 15    Comment: Performed at [facility] [address], [place], [place] [address]  CBC with Differential/Platelet     Status: Abnormal   Collection Time: 02/07/18  5:04 AM  Result Value Ref Range   WBC 16.4 (H) 4.0 - 10.5 K/uL   RBC 3.58 (L) 3.87 - 5.11 MIL/uL   Hemoglobin 11.2 (L) 12.0 - 15.0 g/dL   HCT 34.2 (L) 36.0 - 46.0 %   MCV 95.5 78.0 - 100.0 fL   MCH 31.3 26.0 - 34.0 pg   MCHC 32.7  30.0 - 36.0 g/dL   RDW 14.6 11.5 - 15.5 %   Platelets 186 150 - 400 K/uL   Neutrophils Relative % 37 %   Lymphocytes Relative 53 %   Monocytes Relative 7 %   Eosinophils Relative 3 %   Basophils Relative 0 %   Neutro Abs 6.1 1.7 - 7.7 K/uL   Lymphs Abs 8.7 (H) 0.7 - 4.0 K/uL   Monocytes Absolute 1.1 (H) 0.1 - 1.0 K/uL   Eosinophils Absolute 0.5 0.0 - 0.7 K/uL   Basophils Absolute 0.0 0.0 - 0.1 K/uL   RBC Morphology POLYCHROMASIA PRESENT    WBC Morphology ABSOLUTE LYMPHOCYTOSIS     Comment: Performed at [facility], [address] [place], [place] [address]  Glucose, capillary     Status: None   Collection Time: 02/07/18  6:07 AM  Result Value Ref Range   Glucose-Capillary 94 70 - 99 mg/dL   Comment 1 Notify RN    Comment 2 Document in Chart   Glucose, capillary     Status: Abnormal   Collection Time: 02/07/18 11:20 AM  Result Value Ref Range   Glucose-Capillary 130 (H) 70 - 99 mg/dL   Comment 1 Notify RN    Comment 2 Document in Chart   Glucose, capillary     Status: None   Collection Time: 02/07/18  4:17 PM  Result Value Ref Range   Glucose-Capillary 94 70 - 99 mg/dL   Comment 1 Notify RN    Comment 2 Document in Chart   Glucose, capillary     Status: Abnormal   Collection Time: 02/07/18  9:23 PM  Result Value Ref Range   Glucose-Capillary 108 (H) 70 - 99 mg/dL   Comment 1 Notify RN    Comment 2 Document in Chart   Glucose, capillary     Status: Abnormal   Collection Time: 02/07/18 11:36 PM  Result Value Ref Range   Glucose-Capillary 123 (H) 70 - 99 mg/dL   Comment 1 Notify RN    Comment 2 Document in Chart   CBC with Differential/Platelet     Status: Abnormal   Collection Time: 02/08/18  3:47 AM  Result Value Ref Range   WBC 32.8 (H) 4.0 - 10.5 K/uL   RBC 4.14 3.87 - 5.11 MIL/uL   Hemoglobin 13.0 12.0 - 15.0 g/dL   HCT 39.4 36.0 - 46.0 %   MCV 95.2 78.0 - 100.0 fL   MCH 31.4 26.0 - 34.0 pg   MCHC 33.0 30.0 - 36.0 g/dL   RDW 14.7 11.5 - 15.5 %    Platelets 291 150 - 400 K/uL   Neutrophils Relative % 35 %   Lymphocytes Relative 60 %   Monocytes Relative 3 %   Eosinophils Relative 1 %   Basophils Relative 1 %   Neutro Abs 11.5 (H) 1.7 - 7.7 K/uL   Lymphs Abs 19.7 (H) 0.7 - 4.0 K/uL   Monocytes Absolute 1.0 0.1 - 1.0 K/uL   Eosinophils Absolute 0.3 0.0 - 0.7 K/uL   Basophils Absolute 0.3 (H) 0.0 - 0.1 K/uL   WBC Morphology ABSOLUTE LYMPHOCYTOSIS     Comment: SMUDGE CELLS Performed at [facility], [address], [place], [place] [address]   Glucose, capillary     Status: Abnormal   Collection Time: 02/08/18  4:18 AM  Result Value Ref Range   Glucose-Capillary 120 (H) 70 - 99 mg/dL   Comment 1 Notify RN    Comment 2 Document in Chart   Glucose, capillary     Status: Abnormal   Collection Time: 02/08/18  8:02 AM  Result Value Ref Range   Glucose-Capillary 132 (H) 70 - 99 mg/dL   Dg Abd 2 Views  Result Date: 02/06/2018 CLINICAL DATA:  Ileus. EXAM: ABDOMEN - 2 VIEW COMPARISON:  Radiograph dated 02/04/2018 and CT scan dated 02/04/2018 FINDINGS: Interval decreased small bowel distention since the prior study. There remain a couple of slightly prominent small bowel loops in the mid abdomen. Colon is not distended. No visible free air. No acute bone abnormality. IMPRESSION: Improving small bowel distention consistent with improving ileus or resolving small bowel obstruction. Electronically Signed   By: Lorriane Shire M.D.  On: 02/06/2018 10:31   Dg Abd Portable 1v  Result Date: 02/07/2018 CLINICAL DATA:  Small-bowel obstruction EXAM: PORTABLE ABDOMEN - 1 VIEW COMPARISON:  02/06/2018 FINDINGS: Dilated central abdominal loops of bowel measure up to about 4.6 cm in diameter, and are compatible with mild small bowel obstruction. The degree of small-bowel dilatation is increased from yesterday. Levoconvex lumbar scoliosis.  Distended stomach. IMPRESSION: 1. Increase in small bowel distension over the last day, currently moderate. The  appearance is nonspecific but may well represent small bowel obstruction. The stomach is also somewhat distended. Electronically Signed   By: Van Clines M.D.   On: 02/07/2018 21:30      Assessment/Plan HTN Atrial fibrillation HLD Embolic CVA 8/48/59 CLL with leukocytosis - diagnosed during this hospitalization R thyroid nodule 15 mm - Outpatient [place] L breast masses - outpatient follow up w/ Dr. Rogue Bussing at [facility] CKD stage III with L renal mass and atrophy  SBO vs. Ileus - patient appears nauseated multiple times while I was in the room - mild abdominal distention - recommend inserting NGT and small bowel protocol  - recommend changing eliquis to heparin gtt in case pt were to require surgical intervention, but hopefully this will resolve with conservative measures.   FEN: NPO, IVF VTE: SCDs, eliquis ID: no current abx  Brigid Re, [facility] Surgery 02/08/2018, 10:08 AM Pager: [phone] Consults: [phone] Mon-Fri 7:00 am-4:30 pm Sat-Sun 7:00 am-11:30 am

## 2018-02-08 NOTE — Progress Notes (Signed)
OT Cancellation Note  Patient Details Name: Kaitlyn Bauer MRN: [id number] DOB: 12-27-1921   Cancelled Treatment:    Reason Eval/Treat Not Completed: Medical issues which prohibited therapy. Second attempt. Pt has been very nauseous today. Spoke with granddaughter who was present in the room. NG tube placement planned. Plan to reattempt OT session tomorrow.   Hortencia Pilar 02/08/2018, 12:17 PM

## 2018-02-08 NOTE — Progress Notes (Signed)
[facility] for Heparin Indication: atrial fibrillation and stroke  Allergies  Allergen Reactions  . Penicillins Hives    Has patient had a PCN reaction causing immediate rash, facial/tongue/throat swelling, SOB or lightheadedness with hypotension: Unknown Has patient had a PCN reaction causing severe rash involving mucus membranes or skin necrosis: Unknown Has patient had a PCN reaction that required hospitalization: Unknown Has patient had a PCN reaction occurring within the last 10 years: Unknown If all of the above answers are "NO", then may proceed with Cephalosporin use.    Patient Measurements: Height: 5\' 1"  (154.9 cm) Weight: 133 lb 2.5 oz (60.4 kg) IBW/kg (Calculated) : 47.8 Heparin Dosing Weight: 60 kg  Vital Signs: Temp: 97.9 F (36.6 C) (07/18 1947) Temp Source: Oral (07/18 1947) BP: 123/55 (07/18 1947) Pulse Rate: 109 (07/18 1947)  Labs: Recent Labs    02/06/18 0453 02/07/18 0504 02/08/18 0347 02/08/18 2156  HGB 11.5* 11.2* 13.0  --   HCT 35.4* 34.2* 39.4  --   PLT 223 186 291  --   APTT  --   --   --  48*  CREATININE 1.20* 1.00  --   --     Estimated Creatinine Clearance: 29.3 mL/min (by C-G formula based on SCr of 1 mg/dL).   Medical History: Past Medical History:  Diagnosis Date  . Hypertension     Medications:  Scheduled:  . amLODipine  10 mg Oral Daily  . atorvastatin  10 mg Oral q1800  . hydrALAZINE  25 mg Oral TID  . multivitamin with minerals  1 tablet Oral QAC supper    Assessment: Patient is a 84 YO female with a past medical history of HTN and afib not on anticoagulation prior to admission being started on heparin for stroke and afib. Patient was started on apixaban on 7/11 and her last dose was on 7/17 at about 10:30 PM. Apixaban was stopped in anticipation of patient needing surgery for small bowel obstruction.  Goal of Therapy:  Heparin level 0.3-0.5 units/ml aPTT 66-85 seconds Monitor  platelets by anticoagulation protocol: Yes   Plan:  Inc heparin to 950 units/hr 0700 aPTT/HL Monitor heparin level and aPTT levels until correlated  Continue to monitor H&H and platelets  Narda Bonds, PharmD, BCPS Clinical Pharmacist Phone: [phone]

## 2018-02-08 NOTE — Progress Notes (Signed)
PROGRESS NOTE    Kaitlyn Bauer  ZOX:[id number] DOB: Oct 03, 1921 DOA: 02/10/2018 PCP: Baxter Hire, MD   Brief Narrative: Patient is a 84 year old female with past medical history of hypertension was admitted for multifocal acute left MCA territory nonhemorrhagic infarcts.  Neurology is the primary team.  She developed nausea and vomiting during the hospitalization.  CT imaging was concerning for small bowel obstruction.  Hospitalist service was consulted for ileus.  General surgery consulted today for possible worsening of small bowel obstruction.  Assessment & Plan:   Principal Problem:   Acute ischemic left MCA stroke (HCC) Active Problems:   Essential hypertension   Adynamic ileus (HCC)   Breast mass in female x 2, left   Leukocytosis, like d/t CLL   Left renal atrophy   Hyperglycemia   Chronic kidney disease, stage 3, mod decreased GFR (HCC)   Thyroid nodule, right   Abdominal pain   Atrial fibrillation (HCC)   Hyperlipidemia   Ileus (HCC)   Small bowel obstruction (HCC)  Ileus: CT imaging concerning for ileus versus small bowel obstruction.  Patient is a very poor historian and very hard on hearing so  reliable  information could not be taken from her. She does not have a bowel movement today. Passing flatus. She still complains of abdominal discomfort.  Abdomen is slightly distended and has mild generalized tenderness.  Bowel sounds are sluggish.  Currently she is NPO.  Surgery consulted today and she is undergoing a small bowel protocol.  Acute ischemic stroke: Multiple left MCA infarcts, likely embolic secondary to known atrial fibrillation not on anticoagulation .Neurology was following.  On Eliquis and statin.  Afib: CHA2DS2-VASc of 4.  Was on Eliquis.  Eliquis will be stopped and changed to heparin drip in case she needs surgery.  Hypertension: Blood pressure stable.  Continue to monitor.  Left breast mass: Likely breast cancer.She will follow-up with oncology Dr.  Rogue Bussing in [place]. Neurology sent Sent epic message and left VM at the office  Leukocytosis:Worsened today.Most likely chronic.History of CLL which could be contributing. Cultures are negative.  Chest x-ray did not show any infiltrate.  Left renal mass/atrophy: Outpatient evaluation by urology could be considered.  Hyperglycemia: Hemoglobin A1c was a 5.9.  CKD stage 3: Stable  Right thyroid nodule: TSH 2.6.  Needs outpatient work-up.  Deconditioning/debility /stroke: Patient evaluated by  PT and OT and recommended home health physical therapy.Possibilty of DC to assisted living facility when stable.    DVT prophylaxis: Heparin drip Code Status: DNR Family Communication: [place] Daughter present at the bedside Antimicrobials:None  Subjective: Patient seen and examined the bedside this afternoon.  Does not have a bowel movement today.  Passing flatus.  No nausea or vomiting. Complians of abdominal pain.  Objective: Vitals:   02/07/18 1522 02/07/18 2338 02/08/18 0420 02/08/18 0803  BP: (!) 132/54 (!) 144/63 (!) 140/55 (!) 127/46  Pulse: 88 99 (!) 104 (!) 108  Resp: 16 14 14 18   Temp: (!) 97.4 F (36.3 C) 98.3 F (36.8 C) 98 F (36.7 C) 98.2 F (36.8 C)  TempSrc: Oral Oral Oral   SpO2: 94% 92% 94% 94%  Weight:      Height:        Intake/Output Summary (Last 24 hours) at 02/08/2018 1118 Last data filed at 02/08/2018 0400 Gross per 24 hour  Intake -  Output 25 ml  Net -25 ml   Filed Weights   02/11/2018 2210 02/01/18 0015  Weight: 67.3 kg (148 lb 5.9  oz) 60.4 kg (133 lb 2.5 oz)    Examination:  General exam: Appears calm and comfortable ,Not in distress, early female HEENT:PERRL,Oral mucosa moist, Ear/Nose normal on gross exam,HOH Respiratory system: Bilateral equal air entry, normal vesicular breath sounds, no wheezes or crackles  Cardiovascular system: S1 & S2 heard, RRR. No JVD, murmurs, rubs, gallops or clicks. No pedal edema. Gastrointestinal system: Abdomen   Mildly distended, soft and mild generalized tendereness. No organomegaly or masses felt. Slow bowel sounds Central nervous system: Alert but not fully oriented. No focal neurological deficits. Extremities: No edema, no clubbing ,no cyanosis, distal peripheral pulses palpable. Skin: No rashes, lesions or ulcers,no icterus ,no pallor      Data Reviewed: I have personally reviewed following labs and imaging studies  CBC: Recent Labs  Lab 02/04/18 0745 02/05/18 0730 02/06/18 0453 02/07/18 0504 02/08/18 0347  WBC 28.2* 31.4* 20.6* 16.4* 32.8*  NEUTROABS 11.0* 11.9* 7.0 6.1 11.5*  HGB 13.3 12.5 11.5* 11.2* 13.0  HCT 40.6 38.3 35.4* 34.2* 39.4  MCV 94.2 94.6 95.9 95.5 95.2  PLT 242 241 223 186 297   Basic Metabolic Panel: Recent Labs  Lab 02/03/18 0655 02/04/18 0409 02/05/18 0730 02/06/18 0453 02/07/18 0504  NA 136 139 140 142 139  K 3.9 3.7 3.8 3.7 3.7  CL 101 101 105 107 109  CO2 27 29 27 28 25   GLUCOSE 121* 147* 132* 117* 103*  BUN 20 24* 25* 26* 23  CREATININE 1.08* 1.08* 1.20* 1.20* 1.00  CALCIUM 9.1 8.9 8.3* 8.2* 8.3*   GFR: Estimated Creatinine Clearance: 29.3 mL/min (by C-G formula based on SCr of 1 mg/dL). Liver Function Tests: No results for input(s): AST, ALT, ALKPHOS, BILITOT, PROT, ALBUMIN in the last 168 hours. No results for input(s): LIPASE, AMYLASE in the last 168 hours. Recent Labs  Lab 02/03/18 0655  AMMONIA 34   Coagulation Profile: No results for input(s): INR, PROTIME in the last 168 hours. Cardiac Enzymes: No results for input(s): CKTOTAL, CKMB, CKMBINDEX, TROPONINI in the last 168 hours. BNP (last 3 results) No results for input(s): PROBNP in the last 8760 hours. HbA1C: No results for input(s): HGBA1C in the last 72 hours. CBG: Recent Labs  Lab 02/07/18 1617 02/07/18 2123 02/07/18 2336 02/08/18 0418 02/08/18 0802  GLUCAP 94 108* 123* 120* 132*   Lipid Profile: No results for input(s): CHOL, HDL, LDLCALC, TRIG, CHOLHDL, LDLDIRECT  in the last 72 hours. Thyroid Function Tests: No results for input(s): TSH, T4TOTAL, FREET4, T3FREE, THYROIDAB in the last 72 hours. Anemia Panel: No results for input(s): VITAMINB12, FOLATE, FERRITIN, TIBC, IRON, RETICCTPCT in the last 72 hours. Sepsis Labs: No results for input(s): PROCALCITON, LATICACIDVEN in the last 168 hours.  Recent Results (from the past 240 hour(s))  MRSA PCR Screening     Status: None   Collection Time: 02/01/18 12:23 AM  Result Value Ref Range Status   MRSA by PCR NEGATIVE NEGATIVE Final    Comment:        The GeneXpert MRSA Assay (FDA approved for NASAL specimens only), is one component of a comprehensive MRSA colonization surveillance program. It is not intended to diagnose MRSA infection nor to guide or monitor treatment for MRSA infections. Performed at [facility], [place] [address], [place], [place] [address]   Culture, Urine     Status: None   Collection Time: 02/02/18  5:48 PM  Result Value Ref Range Status   Specimen Description URINE, CLEAN CATCH  Final   Special Requests  NONE  Final   Culture   Final    NO GROWTH Performed at [facility], [place] [address], [place], [place] [address]    Report Status 02/03/2018 FINAL  Final  Culture, blood (Routine X 2) w Reflex to ID Panel     Status: None (Preliminary result)   Collection Time: 02/04/18  8:40 AM  Result Value Ref Range Status   Specimen Description BLOOD RIGHT HAND  Final   Special Requests   Final    BOTTLES DRAWN AEROBIC AND ANAEROBIC Blood Culture adequate volume   Culture   Final    NO GROWTH 3 DAYS Performed at [facility], [place] [address], [place], [place] [address]    Report Status PENDING  Incomplete  Culture, blood (Routine X 2) w Reflex to ID Panel     Status: None (Preliminary result)   Collection Time: 02/04/18  8:41 AM  Result Value Ref Range Status   Specimen Description BLOOD RIGHT HAND  Final   Special Requests   Final    BOTTLES DRAWN AEROBIC  AND ANAEROBIC Blood Culture adequate volume   Culture   Final    NO GROWTH 3 DAYS Performed at [facility], [place] [address] [place], [place] [address]    Report Status PENDING  Incomplete         Radiology Studies: Dg Abd Portable 1v  Result Date: 02/07/2018 CLINICAL DATA:  Small-bowel obstruction EXAM: PORTABLE ABDOMEN - 1 VIEW COMPARISON:  02/06/2018 FINDINGS: Dilated central abdominal loops of bowel measure up to about 4.6 cm in diameter, and are compatible with mild small bowel obstruction. The degree of small-bowel dilatation is increased from yesterday. Levoconvex lumbar scoliosis.  Distended stomach. IMPRESSION: 1. Increase in small bowel distension over the last day, currently moderate. The appearance is nonspecific but may well represent small bowel obstruction. The stomach is also somewhat distended. Electronically Signed   By: Van Clines M.D.   On: 02/07/2018 21:30        Scheduled Meds: . amLODipine  10 mg Oral Daily  . atorvastatin  10 mg Oral q1800  . diatrizoate meglumine-sodium  90 mL Per NG tube Once  . hydrALAZINE  25 mg Oral TID  . multivitamin with minerals  1 tablet Oral QAC supper   Continuous Infusions: . sodium chloride 1,000 mL (02/08/18 0651)  . sodium chloride Stopped (02/01/18 0843)     LOS: 8 days    Time spent: 35 mins.     Shelly Coss, MD [facility] Pager [phone]  If 7PM-7AM, please contact night-coverage [email or web address] Password [email or web address] 02/08/2018, 11:18 AM

## 2018-02-08 NOTE — Progress Notes (Signed)
SLP Cancellation Note  Patient Details Name: Kaitlyn Bauer MRN: [id number] DOB: [birth year]   Cancelled treatment:   Pt lethargic/minimally responsive.  Family states pt is feeling unwell 2/2 NG placement.  Speech therapy held at this time.  Will reattempt as schedule permits.   [place] 02/08/2018, 1:59 PM

## 2018-02-09 ENCOUNTER — Inpatient Hospital Stay (HOSPITAL_COMMUNITY): Payer: Medicare HMO

## 2018-02-09 DIAGNOSIS — I63412 Cerebral infarction due to embolism of left middle cerebral artery: Principal | ICD-10-CM

## 2018-02-09 LAB — CBC WITH DIFFERENTIAL/PLATELET
BASOS ABS: 0 10*3/uL (ref 0.0–0.1)
BASOS PCT: 0 %
EOS ABS: 0 10*3/uL (ref 0.0–0.7)
Eosinophils Relative: 0 %
HCT: 36 % (ref 36.0–46.0)
Hemoglobin: 11.5 g/dL — ABNORMAL LOW (ref 12.0–15.0)
Lymphocytes Relative: 62 %
Lymphs Abs: 14.3 10*3/uL — ABNORMAL HIGH (ref 0.7–4.0)
MCH: 30.7 pg (ref 26.0–34.0)
MCHC: 31.9 g/dL (ref 30.0–36.0)
MCV: 96.3 fL (ref 78.0–100.0)
MONO ABS: 1.2 10*3/uL — AB (ref 0.1–1.0)
Monocytes Relative: 5 %
NEUTROS ABS: 7.7 10*3/uL (ref 1.7–7.7)
Neutrophils Relative %: 33 %
PLATELETS: 249 10*3/uL (ref 150–400)
RBC: 3.74 MIL/uL — ABNORMAL LOW (ref 3.87–5.11)
RDW: 15.2 % (ref 11.5–15.5)
WBC: 23.2 10*3/uL — ABNORMAL HIGH (ref 4.0–10.5)

## 2018-02-09 LAB — GLUCOSE, CAPILLARY
GLUCOSE-CAPILLARY: 104 mg/dL — AB (ref 70–99)
GLUCOSE-CAPILLARY: 78 mg/dL (ref 70–99)
GLUCOSE-CAPILLARY: 86 mg/dL (ref 70–99)
GLUCOSE-CAPILLARY: 89 mg/dL (ref 70–99)
GLUCOSE-CAPILLARY: 99 mg/dL (ref 70–99)
Glucose-Capillary: 106 mg/dL — ABNORMAL HIGH (ref 70–99)

## 2018-02-09 LAB — CULTURE, BLOOD (ROUTINE X 2)
CULTURE: NO GROWTH
Culture: NO GROWTH
Special Requests: ADEQUATE
Special Requests: ADEQUATE

## 2018-02-09 LAB — BASIC METABOLIC PANEL
ANION GAP: 9 (ref 5–15)
BUN: 25 mg/dL — ABNORMAL HIGH (ref 8–23)
CALCIUM: 8.2 mg/dL — AB (ref 8.9–10.3)
CO2: 24 mmol/L (ref 22–32)
CREATININE: 1.05 mg/dL — AB (ref 0.44–1.00)
Chloride: 111 mmol/L (ref 98–111)
GFR, EST AFRICAN AMERICAN: 51 mL/min — AB (ref >60)
GFR, EST NON AFRICAN AMERICAN: 44 mL/min — AB (ref >60)
Glucose, Bld: 112 mg/dL — ABNORMAL HIGH (ref 70–99)
Potassium: 4.2 mmol/L (ref 3.5–5.1)
Sodium: 144 mmol/L (ref 135–145)

## 2018-02-09 LAB — APTT
APTT: 87 s — AB (ref 24–36)
APTT: 181 s — AB (ref 24–36)
aPTT: 186 seconds (ref 24–36)

## 2018-02-09 LAB — HEPARIN LEVEL (UNFRACTIONATED): HEPARIN UNFRACTIONATED: 1.34 [IU]/mL — AB (ref 0.30–0.70)

## 2018-02-09 NOTE — Evaluation (Signed)
Clinical/Bedside Swallow Evaluation Patient Details  Name: Kaitlyn Bauer MRN: [id number] Date of Birth: 04-10-1922  Today's Date: 02/09/2018 Time: SLP Start Time (ACUTE ONLY): 1340 SLP Stop Time (ACUTE ONLY): 1358 SLP Time Calculation (min) (ACUTE ONLY): 18 min  Past Medical History:  Past Medical History:  Diagnosis Date  . Hypertension    Past Surgical History:  Past Surgical History:  Procedure Laterality Date  . ABDOMINAL HYSTERECTOMY    . APPENDECTOMY    . CHOLECYSTECTOMY     HPI:  Patient is a 84 year old female with past medical history of hypertension was admitted for multifocal acute left MCA territory nonhemorrhagic infarcts and expressive aphasia. Neurology is the primary team. She developed nausea and vomiting during the hospitalization. CT imaging was concerning for small bowel obstruction. Hospitalist service was consulted for ileus. General surgery consulted for possible worsening of small bowel obstruction.NG tube has been taken out today. Plan is to evaluate by speech therapy and if she does well, start on clear liquid diet today.   Assessment / Plan / Recommendation Clinical Impression  Prior to small bowel obstruction, pt was consuming regular diet with thin liquids without overt s/s of aspiration or oropharyngeal dysphagia. Pt recently NPo d/t small bowel obstruction. Received order for BSE to assess for safety and then initiate clear liquid diet. Pt continues to present with functional oropharyngeal abilties as evidenced by no overt s/s of aspiraiton when consuming 12 oz thin water via straw. Pt iwth appearance of timely strong swallow initiation. Would recommend initiating clear liquid diet and MD may progress diet as he wishes. No further ST services are indicated for dysphagia, ST to target expressive aphasia. Education provided to nursing on diet recommendation.  SLP Visit Diagnosis: Dysphagia, unspecified (R13.10)    Aspiration Risk  No limitations     Diet Recommendation (clear liquid diet)   Liquid Administration via: Straw;Cup;Spoon Medication Administration: Via alternative means(per MD direction) Supervision: Patient able to self feed    Other  Recommendations Oral Care Recommendations: Oral care BID   Follow up Recommendations Skilled Nursing facility      Frequency and Duration min 2x/week  2 weeks       Prognosis Prognosis for Safe Diet Advancement: Good      Swallow Study   General Date of Onset: 02/08/18 HPI: Patient is a 84 year old female with past medical history of hypertension was admitted for multifocal acute left MCA territory nonhemorrhagic infarcts and expressive aphasia. Neurology is the primary team. She developed nausea and vomiting during the hospitalization. CT imaging was concerning for small bowel obstruction. Hospitalist service was consulted for ileus. General surgery consulted for possible worsening of small bowel obstruction.NG tube has been taken out today. Plan is to evaluate by speech therapy and if she does well, start on clear liquid diet today. Type of Study: Bedside Swallow Evaluation Previous Swallow Assessment: none in chart, not being followed for dysphagia Diet Prior to this Study: NPO(d/t small bowel obstruction) Temperature Spikes Noted: No Respiratory Status: Room air History of Recent Intubation: No Behavior/Cognition: Alert;Cooperative;Pleasant mood Oral Cavity Assessment: Within Functional Limits Oral Care Completed by SLP: No Oral Cavity - Dentition: Adequate natural dentition Vision: Functional for self-feeding Self-Feeding Abilities: Able to feed self Patient Positioning: Upright in bed Baseline Vocal Quality: Normal Volitional Cough: Strong Volitional Swallow: Able to elicit    Oral/Motor/Sensory Function Overall Oral Motor/Sensory Function: Within functional limits(improviing)   Ice Chips Ice chips: Within functional limits Presentation: Self Fed;Spoon   Thin  Liquid Thin Liquid:  Within functional limits Presentation: Self Fed;Straw    Nectar Thick Nectar Thick Liquid: Not tested   Honey Thick Honey Thick Liquid: Not tested   Puree Puree: Not tested   Solid   GO   Solid: Not tested        Toney Difatta 02/09/2018,3:48 PM

## 2018-02-09 NOTE — Social Work (Signed)
CSW acknowledging SNF consult, pt still ambulating around 200+ feet with therapy. Therapy recommendations still are for [facility] therapy.   [employer] Medicare will not authorize pt with current recommendations.  CSW signing off. Please consult if any additional needs arise.  [place], [place] Work [phone]

## 2018-02-09 NOTE — Progress Notes (Signed)
Patient removed NGT @2300 , attempted reinsertion one time but it was unsuccessful. Patient resisted and was agitated, stated she doesn't want NGT. MD notified, No new order given. BM was 0145, large amount of green liquid stool.

## 2018-02-09 NOTE — Progress Notes (Signed)
ANTICOAGULATION CONSULT NOTE   Pharmacy Consult for Heparin Indication: atrial fibrillation and stroke  Patient Measurements: Height: 5\' 1"  (154.9 cm) Weight: 133 lb 2.5 oz (60.4 kg) IBW/kg (Calculated) : 47.8 Heparin Dosing Weight: 60 kg  Vital Signs: Temp: 97.5 F (36.4 C) (07/19 1914) Temp Source: Oral (07/19 1914) BP: 140/66 (07/19 1914) Pulse Rate: 101 (07/19 1914)  Labs: Recent Labs    02/07/18 0504 02/08/18 0347 02/08/18 2156 02/09/18 0341 02/09/18 0645 02/09/18 1931  HGB 11.2* 13.0  --  11.5*  --   --   HCT 34.2* 39.4  --  36.0  --   --   PLT 186 291  --  249  --   --   APTT  --   --  48*  --  87* 186*  HEPARINUNFRC  --   --   --   --  1.34*  --   CREATININE 1.00  --   --  1.05*  --   --     Estimated Creatinine Clearance: 27.9 mL/min (A) (by C-G formula based on SCr of 1.05 mg/dL (H)).   Assessment: Patient is a 84 YO female with a past medical history of HTN and afib not on anticoagulation prior to admission being started on heparin for stroke and afib. Patient was started on apixaban on 7/11 and her last dose was on 7/17 at about 10:30 PM. Apixaban was stopped in anticipation of patient needing surgery for small bowel obstruction.  She pulled NGT out 7/18 pm and had BM this morning; starting clears if passes speech eval. aPTT is just above goal at 87 sec, heparin level 1.34. No bleeding noted, Hgb and platelets are stable.   aPTT tonight is 186 seconds, supartherapeutic, on 900 units/hr.  Last aPTT was 87 seconds on 950 units/hr.  Cannot confirm where sample was drawn, but value more than double prior value after small decrease in infusion rate.  Goal of Therapy:  Heparin level 0.3-0.5 units/ml aPTT 66-85 seconds Monitor platelets by anticoagulation protocol: Yes   Plan:  Will repeat aPTT to confirm. Continue heparin drip at 900 units/hr Monitor heparin level and aPTT levels until correlate. Daily CBC. F/U resuming apixaban as able to take POs  Claudie Fisherman [place]: [phone] 02/09/2018 9:44 PM

## 2018-02-09 NOTE — Progress Notes (Signed)
PROGRESS NOTE    Kaitlyn Bauer  LEX:[id number] DOB: [birth year] DOA: 02/14/2018 PCP: Baxter Hire, MD   Brief Narrative: Patient is a 84 year old female with past medical history of hypertension was admitted for multifocal acute left MCA territory nonhemorrhagic infarcts.  Neurology is the primary team.  She developed nausea and vomiting during the hospitalization.  CT imaging was concerning for small bowel obstruction.  Hospitalist service was consulted for ileus.  General surgery consulted  for possible worsening of small bowel obstruction.  NG tube has been taken out today.  Plan is to evaluate by speech therapy and if she does well ,start on clear liquid diet today.  Assessment & Plan:   Principal Problem:   Acute ischemic left MCA stroke (HCC) Active Problems:   Essential hypertension   Adynamic ileus (HCC)   Breast mass in female x 2, left   Leukocytosis, like d/t CLL   Left renal atrophy   Hyperglycemia   Chronic kidney disease, stage 3, mod decreased GFR (HCC)   Thyroid nodule, right   Abdominal pain   Atrial fibrillation (HCC)   Hyperlipidemia   Ileus (HCC)   Small bowel obstruction (HCC)  Ileus: Surgery following.  Plan is to take the the NG out today.  Speech will evaluate her , if she does well ,she will be started on clear liquid diet.  Abdomen is softer today.  Does not complain of any abdominal pain.  No nausea or vomiting.  Acute ischemic stroke: Multiple left MCA infarcts, likely embolic secondary to known atrial fibrillation not on anticoagulation .Neurology was following.  On Eliquis and statin.  Afib: CHA2DS2-VASc of 4.  Was on Eliquis.  Eliquis has been on hold and changed to heparin drip .  We will resume Eliquis if she does well with the swallowing evaluation by speech.  Hypertension: Blood pressure stable.  Continue to monitor.  Left breast mass: Likely breast cancer.She will follow-up with oncology Dr. Rogue Bussing in [place]. Neurology sent Sent epic  message and left VM at the office  Leukocytosis:Worsened today.Most likely chronic.History of CLL which could be contributing. Cultures are negative.  Chest x-ray did not show any infiltrate.  Left renal mass/atrophy: Outpatient evaluation by urology could be considered.  Hyperglycemia: Hemoglobin A1c was a 5.9.  CKD stage 3: Stable  Right thyroid nodule: TSH 2.6.  Needs outpatient work-up.  Deconditioning/debility /stroke: Initially patient evaluated by  PT and OT and recommended home health physical therapy.Her overall condition has changed.  We have requested for reevaluation by PT.   DVT prophylaxis: Heparin drip Code Status: DNR Family Communication: Daughter present at the bedside Disposition: Home with home health versus skilled nursing facility.  Awaiting reevaluation by PT.  Antimicrobials:None  Subjective: Patient seen and examined the bedside this afternoon.  She looks more comfortable today.  Abdomen is soft and nontender.  NG tube taken out.   Objective: Vitals:   02/08/18 1947 02/08/18 2330 02/09/18 0412 02/09/18 0818  BP: (!) 123/55 138/72 139/66 (!) 150/90  Pulse: (!) 109 (!) 108 (!) 107 (!) 104  Resp: 20 18 18 18   Temp: 97.9 F (36.6 C) 98 F (36.7 C) 98.1 F (36.7 C) 98.5 F (36.9 C)  TempSrc: Oral Oral Oral Oral  SpO2: 91% 92% 96% 92%  Weight:      Height:       No intake or output data in the 24 hours ending 02/09/18 1208 Filed Weights   02/08/2018 2210 02/01/18 0015  Weight: 67.3 kg (148  lb 5.9 oz) 60.4 kg (133 lb 2.5 oz)    Examination:  General exam: Appears calm and comfortable ,Not in distress, early female HEENT:PERRL,Oral mucosa moist, Ear/Nose normal on gross exam,HOH Respiratory system: Bilateral equal air entry, normal vesicular breath sounds, no wheezes or crackles  Cardiovascular system: S1 & S2 heard, RRR. No JVD, murmurs, rubs, gallops or clicks. No pedal edema. Gastrointestinal system: Abdomen  Is  soft and no tendereness. No  organomegaly or masses felt. Slow bowel sounds Central nervous system: Alert but not fully oriented. No focal neurological deficits. Extremities: No edema, no clubbing ,no cyanosis, distal peripheral pulses palpable. Skin: No rashes, lesions or ulcers,no icterus ,no pallor      Data Reviewed: I have personally reviewed following labs and imaging studies  CBC: Recent Labs  Lab 02/05/18 0730 02/06/18 0453 02/07/18 0504 02/08/18 0347 02/09/18 0341  WBC 31.4* 20.6* 16.4* 32.8* 23.2*  NEUTROABS 11.9* 7.0 6.1 11.5* 7.7  HGB 12.5 11.5* 11.2* 13.0 11.5*  HCT 38.3 35.4* 34.2* 39.4 36.0  MCV 94.6 95.9 95.5 95.2 96.3  PLT 241 223 186 291 937   Basic Metabolic Panel: Recent Labs  Lab 02/04/18 0409 02/05/18 0730 02/06/18 0453 02/07/18 0504 02/09/18 0341  NA 139 140 142 139 144  K 3.7 3.8 3.7 3.7 4.2  CL 101 105 107 109 111  CO2 29 27 28 25 24   GLUCOSE 147* 132* 117* 103* 112*  BUN 24* 25* 26* 23 25*  CREATININE 1.08* 1.20* 1.20* 1.00 1.05*  CALCIUM 8.9 8.3* 8.2* 8.3* 8.2*   GFR: Estimated Creatinine Clearance: 27.9 mL/min (A) (by C-G formula based on SCr of 1.05 mg/dL (H)). Liver Function Tests: No results for input(s): AST, ALT, ALKPHOS, BILITOT, PROT, ALBUMIN in the last 168 hours. No results for input(s): LIPASE, AMYLASE in the last 168 hours. Recent Labs  Lab 02/03/18 0655  AMMONIA 34   Coagulation Profile: No results for input(s): INR, PROTIME in the last 168 hours. Cardiac Enzymes: No results for input(s): CKTOTAL, CKMB, CKMBINDEX, TROPONINI in the last 168 hours. BNP (last 3 results) No results for input(s): PROBNP in the last 8760 hours. HbA1C: No results for input(s): HGBA1C in the last 72 hours. CBG: Recent Labs  Lab 02/08/18 1944 02/09/18 0034 02/09/18 0413 02/09/18 0738 02/09/18 1150  GLUCAP 119* 99 106* 86 78   Lipid Profile: No results for input(s): CHOL, HDL, LDLCALC, TRIG, CHOLHDL, LDLDIRECT in the last 72 hours. Thyroid Function Tests: No  results for input(s): TSH, T4TOTAL, FREET4, T3FREE, THYROIDAB in the last 72 hours. Anemia Panel: No results for input(s): VITAMINB12, FOLATE, FERRITIN, TIBC, IRON, RETICCTPCT in the last 72 hours. Sepsis Labs: No results for input(s): PROCALCITON, LATICACIDVEN in the last 168 hours.  Recent Results (from the past 240 hour(s))  MRSA PCR Screening     Status: None   Collection Time: 02/01/18 12:23 AM  Result Value Ref Range Status   MRSA by PCR NEGATIVE NEGATIVE Final    Comment:        The GeneXpert MRSA Assay (FDA approved for NASAL specimens only), is one component of a comprehensive MRSA colonization surveillance program. It is not intended to diagnose MRSA infection nor to guide or monitor treatment for MRSA infections. Performed at [facility], [place] [address] [place], [place] [address]   Culture, Urine     Status: None   Collection Time: 02/02/18  5:48 PM  Result Value Ref Range Status   Specimen Description URINE, CLEAN CATCH  Final  Special Requests NONE  Final   Culture   Final    NO GROWTH Performed at [facility], [place] [address], [place], [place] [address]    Report Status 02/03/2018 FINAL  Final  Culture, blood (Routine X 2) w Reflex to ID Panel     Status: None   Collection Time: 02/04/18  8:40 AM  Result Value Ref Range Status   Specimen Description BLOOD RIGHT HAND  Final   Special Requests   Final    BOTTLES DRAWN AEROBIC AND ANAEROBIC Blood Culture adequate volume   Culture   Final    NO GROWTH 5 DAYS Performed at [facility], [place] [address], [place], [place] [address]    Report Status 02/09/2018 FINAL  Final  Culture, blood (Routine X 2) w Reflex to ID Panel     Status: None   Collection Time: 02/04/18  8:41 AM  Result Value Ref Range Status   Specimen Description BLOOD RIGHT HAND  Final   Special Requests   Final    BOTTLES DRAWN AEROBIC AND ANAEROBIC Blood Culture adequate volume   Culture   Final    NO GROWTH 5  DAYS Performed at [facility], [place] [address], [place], [place] [address]    Report Status 02/09/2018 FINAL  Final         Radiology Studies: Dg Abd 1 View  Result Date: 02/08/2018 CLINICAL DATA:  NG tube placement EXAM: ABDOMEN - 1 VIEW COMPARISON:  None. FINDINGS: NG tube tip is in the mid to distal stomach. IMPRESSION: NG tube tip in the mid to distal stomach. Electronically Signed   By: Rolm Baptise M.D.   On: 02/08/2018 14:01   Dg Abd Portable 1v-small Bowel Obstruction Protocol-initial, 8 Hr Delay  Result Date: 02/09/2018 CLINICAL DATA:  Small-bowel obstruction, follow-up 8 hour imaging EXAM: PORTABLE ABDOMEN - 1 VIEW COMPARISON:  02/07/2018 FINDINGS: Contrast is noted in the expected location of the gastric fundus proximal body. Mild contrast filled distention of small bowel loops is identified however there is contrast that has passed into the colon consistent with a partial SBO. No free air is identified. Cholecystectomy clips are present. IMPRESSION: Enteric contrast is noted in the descending colon. Contrast distended small bowel loops persist. Findings would suggest a partial SBO. Electronically Signed   By: Ashley Royalty M.D.   On: 02/09/2018 03:51   Dg Abd Portable 1v  Result Date: 02/07/2018 CLINICAL DATA:  Small-bowel obstruction EXAM: PORTABLE ABDOMEN - 1 VIEW COMPARISON:  02/06/2018 FINDINGS: Dilated central abdominal loops of bowel measure up to about 4.6 cm in diameter, and are compatible with mild small bowel obstruction. The degree of small-bowel dilatation is increased from yesterday. Levoconvex lumbar scoliosis.  Distended stomach. IMPRESSION: 1. Increase in small bowel distension over the last day, currently moderate. The appearance is nonspecific but may well represent small bowel obstruction. The stomach is also somewhat distended. Electronically Signed   By: Van Clines M.D.   On: 02/07/2018 21:30        Scheduled Meds: . amLODipine  10 mg Oral  Daily  . atorvastatin  10 mg Oral q1800  . hydrALAZINE  25 mg Oral TID  . multivitamin with minerals  1 tablet Oral QAC supper   Continuous Infusions: . sodium chloride 75 mL/hr at 02/09/18 0914  . sodium chloride Stopped (02/01/18 0843)  . heparin 900 Units/hr (02/09/18 1005)     LOS: 9 days    Time spent: 35  mins.     Shelly Coss, MD [facility] Pager [phone]  If 7PM-7AM, please contact night-coverage [email or web address] Password [facility] 02/09/2018, 12:08 PM

## 2018-02-09 NOTE — Progress Notes (Signed)
STROKE TEAM PROGRESS NOTE  INTERVAL HISTORY No family at the bedside. Abdominal soft and not extended. No pain on palpation. Pt no N/V. NG tube took up by herself overnight. Surgery is on board, plan to do speech again. If pass, will start on clear liquid again.   CBC:  Recent Labs  Lab 02/08/18 0347 02/09/18 0341  WBC 32.8* 23.2*  NEUTROABS 11.5* 7.7  HGB 13.0 11.5*  HCT 39.4 36.0  MCV 95.2 96.3  PLT 291 829    Basic Metabolic Panel:  Recent Labs  Lab 02/07/18 0504 02/09/18 0341  NA 139 144  K 3.7 4.2  CL 109 111  CO2 25 24  GLUCOSE 103* 112*  BUN 23 25*  CREATININE 1.00 1.05*  CALCIUM 8.3* 8.2*   IMAGING  Ct Head Wo Contrast 02/02/2018 IMPRESSION:  1. Small left parietal lobe cortical infarctions stable in distribution in comparison with prior MRI of the head.  2. No new acute intracranial abnormality.  3. Stable background of chronic microvascular ischemic changes and parenchymal volume loss of the brain.   Ct Angio Head W Or Wo Contrast Ct Angio Neck W Or Wo Contrast Ct Cerebral Perfusion W Contrast 02/01/2018 IMPRESSION:  1. Left parietal lobe oligemia with ischemic penumbra of 13 cc. No acute infarct by standard perfusion criteria.  2. Left M2 inferior division distal short segment of occlusion/near occlusion with patent downstream vessel.  3. 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  4. 15 mm nodule in the right lobe of thyroid gland, thyroid ultrasound is recommended on a nonemergent basis.   MR Brain WO Contrast 02/01/2018 IMPRESSION:  1. Multifocal acute small LEFT MCA territory nonhemorrhagic infarcts.  2. Numerous chronic microhemorrhages suggesting amyloid angiopathy.  3. Mild-to-moderate chronic small vessel ischemic changes.   Dg Chest Port 1 View 02/03/2018 IMPRESSION:  No active disease.   EEG 02/02/2018 Background: There is a posterior dominant rhythm of 8 Hz which is well-formed.  In addition,  there is focal left-sided delta and theta activity which is maximal in the left anterior temporal region (F7, T7).  Sleep is recorded with symmetric structures Photic stimulation: Physiologic driving is not performed EEG Abnormalities: 1) left temporal slow activity Clinical Interpretation: This EEG is consistent with a focal area of cerebral dysfunction centered in the left anterior temporal region. There was no seizure or seizure predisposition recorded on this study. Please note that a normal EEG does not preclude the possibility of epilepsy.   Transthoracic Echocardiogram  02/01/2018 Study Conclusions - Left ventricle: The cavity size was normal. Wall thickness was increased in a pattern of mild LVH. Systolic function was normal. The estimated ejection fraction was in the range of 60% to 65%. - Aortic valve: There was mild regurgitation. Valve area (VTI): 2.66 cm^2. Valve area (Vmax): 2.98 cm^2. Valve area (Vmean): 2.7 cm^2. Impressions:  Poor acoustic windows limit study.  KUB  02/04/2018 1. Mild dilation of small bowel loops. Findings may reflect an adynamic ileus or a low grade partial obstruction.  CT abdomen/pelvis  02/04/2018 1. Diffuse distention of small bowel tapering to a collapsed terminal ileum. Findings suggest enteritis or partial obstruction. 2. Small volume of ascites and mesenteric edema, possibly reactive inflammation or third-spacing. 3. Two left breast masses measuring up to 4.0 cm. Breast malignancy is suspected and clinical correlation is recommended. 4. Atrophic left kidney. Masslike distension of the left renal pelvis with widened proximal ureter. Findings may represent urothelial carcinoma or pyelitis. 5.  8 mm right lower lobe pulmonary nodule. 6. Small bilateral pleural effusions. 7. Small hiatal hernia and dilated fluid-filled lower esophagus. 8. Sigmoid diverticulosis without findings of acute diverticulitis.  Dg Abd 2 Views 02/06/2018 Improving small bowel  distention consistent with improving ileus or resolving small bowel obstruction.   Dg Abd Portable 1v 02/07/2018 1. Increase in small bowel distension over the last day, currently moderate. The appearance is nonspecific but may well represent small bowel obstruction. The stomach is also somewhat distended.     Dg Abd 1 View  Result Date: 02/08/2018 CLINICAL DATA:  NG tube placement EXAM: ABDOMEN - 1 VIEW COMPARISON:  None. FINDINGS: NG tube tip is in the mid to distal stomach. IMPRESSION: NG tube tip in the mid to distal stomach. Electronically Signed   By: Rolm Baptise M.D.   On: 02/08/2018 14:01   Dg Abd Portable 1v-small Bowel Obstruction Protocol-initial, 8 Hr Delay  Result Date: 02/09/2018 CLINICAL DATA:  Small-bowel obstruction, follow-up 8 hour imaging EXAM: PORTABLE ABDOMEN - 1 VIEW COMPARISON:  02/07/2018 FINDINGS: Contrast is noted in the expected location of the gastric fundus proximal body. Mild contrast filled distention of small bowel loops is identified however there is contrast that has passed into the colon consistent with a partial SBO. No free air is identified. Cholecystectomy clips are present. IMPRESSION: Enteric contrast is noted in the descending colon. Contrast distended small bowel loops persist. Findings would suggest a partial SBO. Electronically Signed   By: Ashley Royalty M.D.   On: 02/09/2018 03:51    PHYSICAL EXAM Vitals:   02/08/18 1625 02/08/18 1947 02/08/18 2330 02/09/18 0412  BP: 139/61 (!) 123/55 138/72 139/66  Pulse: (!) 104 (!) 109 (!) 108 (!) 107  Resp: 20 20 18 18   Temp: 98.2 F (36.8 C) 97.9 F (36.6 C) 98 F (36.7 C) 98.1 F (36.7 C)  TempSrc:  Oral Oral Oral  SpO2: 93% 91% 92% 96%  Weight:      Height:       Pleasant frail elderly lady, lying in bed, in distress from nausea. Passing gas, belching. Afebrile. Head is nontraumatic. Neck is supple without bruit. Cardiac exam no murmur or gallop. Lungs are clear to auscultation. Distal pulses are well  felt. Abd soft and not extended, non-tender on palpation. Bowel sounds present all quads.  Vomited 100cc brown fluid during exam. Neurological Exam ;  Awake, oriented to self, place, month and year. Expressive aphasia with inconsistent anomia, decreased ability to correctly repeat. Able to follow commands. Minimum right lower facial asymmetry when she smiles. Diminished registration. Eye movements full without nystagmus. Vision acuity and fields appear normal. Hearing is diminished bilaterally. Tongue midline. Symmetical strength, tone, reflexes and coordination. Sensation intact. FMM ok in hands. gait not tested.   ASSESSMENT/PLAN Kaitlyn Bauer is a 84 y.o. female with history of HTN and AF not on AC presenting to [facility] with aphasia and facial droop. famiy refused tPA. Improved upon arrival to [facility] so IR not indicated.   Stroke:  Multiple left MCA infarcts embolic secondary to known atrial fibrillation source not on [facility]  Code Stroke CT head No acute stroke. ASPECTS 10.     CTA head & neck L M2 occlusion. R ICA 27mm aneurysm. R thyroid lobe 15 mm nodule  CT perfusion parietal penumbra 13. No acute infarct  MRI  Mult small L MCA infarcts. Small vessel disease.   2D Echo - 60% to 65%. - Poor acoustic windows limit study.  EEG -  no seizure  LDL 98  HgbA1c 5.9  Heparin 5000 units sq tid for VTE prophylaxis  Reports she did NOT take aspirin 325 mg daily prior to admission as reported.  Was on eliquis 2.5mg  bid, now on heparin IV in case for surgery  Therapy recommendations:  [facility] PT & SLP recommended. Have arranged HH PT, OT, ST and NA. Does not qualify for IP or SNF rehab stay. Family welcome to consider ALF if they desire. SW and CM following  Disposition:  Pending  Atrial Fibrillation  Home anticoagulation:  none   CHA2DS2-VASc Score = at least 4, ?2 oral anticoagulation recommended  Age in Years:  ?68   +2    Sex:  Female   +1    Hypertension History:  yes   +1     Diabetes  Mellitus:  0  Congestive Heart Failure History:  0  Vascular Disease History:  0     Stroke/TIA/Thromboembolism History:  yes   +2 . Only 1 reported fall when she was in a hurry - does not fall otherwise. Agreeable to be careful and slower in her movements . Was on eliquis 2.5mg  bid, now on heparin IV in case of surgery  . Once ileus vs. SBO resolves, can resume eliquis.  Abdominal pain, ileus   UA WBC 21-50, UCx negative. On doxycycline at home. Added Macrobid - however developed N/V and abdominal pain. Change macrobid to bactrium - completed course  CXR neg  Significant and diffuse abdominal tenderness on palpation Sunday, improving daily with increased worsening Thurs am  CT abdomen/pelvis diffuse distention small bowel tapering to collapsed terminal ileum, small ascites & mesenteric edema, 2 left breast masses, strophic L kidney w/ mass-like distension, RLL pulm nodule, sm B pleural effusion, sm HH and dilated fluid-filled lower esophagus, sigmoid divertiulosis  Initial KUB w/ mild dilated small bowel loops then Abd xray with improving ileus. Xray with worsening dilated bowel  NPO.   General surgery consulted put on NG suctioning. Pt symptoms improved, self removed NG.   Pending speech to see if pt can take clear  However, KUB still suggest partial SBO  Leukocytosis, most likely secondary to CLL  WBC 15.6->23.5->28.3->19.0 -> 28.2->31.4->20.6->16.4->32.8->23.2  (afebrile)   Completed UTI abx  Urine culture negative  Blood culture neg   Hypertension  Stable . Home meds:  norvasc 10, hydralazine 25 tid . Resume home BP meds . Long-term BP goal normotensive  Hyperlipidemia  Home meds:  No statin  LDL 98, goal < 70  Add lipitor 10 (dtr states she likely will not take, does not like to take meds if they make her feel bad)  Continue statin at discharge  Other Active Problems  R thyroid nodule 15 mm, needs OP [place]  L breast masses, seen by oncology, likely breast  cancer. Likely will need to treat w/ anastrozole and clinical d/u. Referred to Dr. Rogue Bussing in [place]. Unable to do fine need bx here. Will schedule as an OP. Sent epic message and left VM at the office  L renal mass and atopy, can consider OP eval. Possibly related to past workup at [facility] in 2016  Chronic kidney dz stage III, Cr. 1.0  Stable from stroke standpoint. Will sign off. Available as needed. Follow up in 4 weeks. Order placed. Appreciate Dr. Tawanna Solo to take over the case with kind assistance.   Hospital day # 9   Rosalin Hawking, MD PhD Stroke Neurology 02/09/2018 1:12 PM  To contact Stroke Continuity provider, please refer to [email or web address] After hours, contact General Neurology

## 2018-02-09 NOTE — Plan of Care (Signed)
progressing 

## 2018-02-09 NOTE — Progress Notes (Signed)
Patient ID: Kaitlyn Bauer, female   DOB: 11/16/1921, 84 y.o.   MRN: [id number]   Acute Care Surgery Service Progress Note:    Chief Complaint/Subjective: Denies n/v Some abd discomfort Pt pulled NG overnight but also had a BM  Objective: Vital signs in last 24 hours: Temp:  [97.9 F (36.6 C)-99 F (37.2 C)] 98.5 F (36.9 C) (07/19 0818) Pulse Rate:  [102-109] 104 (07/19 0818) Resp:  [18-20] 18 (07/19 0818) BP: (120-150)/(52-90) 150/90 (07/19 0818) SpO2:  [91 %-100 %] 92 % (07/19 0818) Last BM Date: 02/09/18  Intake/Output from previous day: No intake/output data recorded. Intake/Output this shift: No intake/output data recorded.  Lungs: cta, nonlabored  Cardiovascular: reg  Abd: soft, not really distended or tender  Extremities: no edema, +SCDs  Neuro: asleep but wakes up and conversive.  Lab Results: CBC  Recent Labs    02/08/18 0347 02/09/18 0341  WBC 32.8* 23.2*  HGB 13.0 11.5*  HCT 39.4 36.0  PLT 291 249   BMET Recent Labs    02/07/18 0504 02/09/18 0341  NA 139 144  K 3.7 4.2  CL 109 111  CO2 25 24  GLUCOSE 103* 112*  BUN 23 25*  CREATININE 1.00 1.05*  CALCIUM 8.3* 8.2*   LFT Hepatic Function Latest Ref Rng & Units 02/13/2018  Total Protein 6.5 - 8.1 g/dL 6.6  Albumin 3.5 - 5.0 g/dL 3.6  AST 15 - 41 U/L 24  ALT 0 - 44 U/L 10  Alk Phosphatase 38 - 126 U/L 78  Total Bilirubin 0.3 - 1.2 mg/dL 0.6   PT/INR No results for input(s): LABPROT, INR in the last 72 hours. ABG No results for input(s): PHART, HCO3 in the last 72 hours.  Invalid input(s): PCO2, PO2  Studies/Results:  Anti-infectives: Anti-infectives (From admission, onward)   Start     Dose/Rate Route Frequency Ordered Stop   02/03/18 2200  sulfamethoxazole-trimethoprim (BACTRIM DS,SEPTRA DS) 800-160 MG per tablet 1 tablet     1 tablet Oral Every 12 hours 02/03/18 1618 02/06/18 2159   02/01/18 2200  doxycycline (VIBRA-TABS) tablet 100 mg  Status:  Discontinued     100 mg Oral  2 times daily 02/01/18 1626 02/06/18 1406      Medications: Scheduled Meds: . amLODipine  10 mg Oral Daily  . atorvastatin  10 mg Oral q1800  . hydrALAZINE  25 mg Oral TID  . multivitamin with minerals  1 tablet Oral QAC supper   Continuous Infusions: . sodium chloride 75 mL/hr at 02/09/18 0914  . sodium chloride Stopped (02/01/18 0843)  . heparin 950 Units/hr (02/08/18 2315)   PRN Meds:.acetaminophen **OR** acetaminophen (TYLENOL) oral liquid 160 mg/5 mL **OR** acetaminophen, ondansetron (ZOFRAN) IV, senna-docusate  Assessment/Plan: Patient Active Problem List   Diagnosis Date Noted  . Atrial fibrillation ([place]) 02/07/2018  . Hyperlipidemia 02/07/2018  . Ileus ([place])   . Small bowel obstruction ([place])   . Abdominal pain   . Essential hypertension 02/05/2018  . Adynamic ileus ([place]) 02/05/2018  . Breast mass in female x 2, left 02/05/2018  . Leukocytosis, like d/t CLL 02/05/2018  . Left renal atrophy 02/05/2018  . Hyperglycemia 02/05/2018  . Chronic kidney disease, stage 3, mod decreased GFR ([facility]) 02/05/2018  . Thyroid nodule, right 02/05/2018  . Acute ischemic left MCA stroke ([place]) 02/07/2018  . Fall 07/27/2017   HTN Atrial fibrillation HLD Embolic CVA 6/65/99 CLL with leukocytosis - diagnosed during this hospitalization R thyroid nodule 15 mm - Outpatient [place] L breast  masses - outpatient follow up w/ Dr. Rogue Bussing at [facility] CKD stage III with L renal mass and atrophy  SBO vs. Ileus -I think this is more c/w with ileus in looking at ct from other day -SBO protocol - some contrast in stomach but also ctx in colon and desc colon (?contrast in stomach from SBO protocol and contrast in colon from prior CT -can leave NG out - needs speech assessment, if passes speech eval can start clears  FEN: NPO, IVF VTE: SCDs, eliquis--> heparin ID: no current abx Disposition: if passes speech eval, can start clears.   LOS: 9 days    Leighton Ruff. Redmond Pulling, MD, FACS General, Bariatric,  & Minimally Invasive Surgery [phone] [facility]

## 2018-02-09 NOTE — Progress Notes (Signed)
[place] for Heparin Indication: atrial fibrillation and stroke  Allergies  Allergen Reactions  . Penicillins Hives    Has patient had a PCN reaction causing immediate rash, facial/tongue/throat swelling, SOB or lightheadedness with hypotension: Unknown Has patient had a PCN reaction causing severe rash involving mucus membranes or skin necrosis: Unknown Has patient had a PCN reaction that required hospitalization: Unknown Has patient had a PCN reaction occurring within the last 10 years: Unknown If all of the above answers are "NO", then may proceed with Cephalosporin use.    Patient Measurements: Height: 5\' 1"  (154.9 cm) Weight: 133 lb 2.5 oz (60.4 kg) IBW/kg (Calculated) : 47.8 Heparin Dosing Weight: 60 kg  Vital Signs: Temp: 98.5 F (36.9 C) (07/19 0818) Temp Source: Oral (07/19 0818) BP: 150/90 (07/19 0818) Pulse Rate: 104 (07/19 0818)  Labs: Recent Labs    02/07/18 0504 02/08/18 0347 02/08/18 2156 02/09/18 0341 02/09/18 0645  HGB 11.2* 13.0  --  11.5*  --   HCT 34.2* 39.4  --  36.0  --   PLT 186 291  --  249  --   APTT  --   --  48*  --  87*  HEPARINUNFRC  --   --   --   --  1.34*  CREATININE 1.00  --   --  1.05*  --     Estimated Creatinine Clearance: 27.9 mL/min (A) (by C-G formula based on SCr of 1.05 mg/dL (H)).  Medications:  Infusion:  . sodium chloride 75 mL/hr at 02/09/18 0914  . sodium chloride Stopped (02/01/18 0843)  . heparin 950 Units/hr (02/08/18 2315)   Assessment: Patient is a 84 YO female with a past medical history of HTN and afib not on anticoagulation prior to admission being started on heparin for stroke and afib. Patient was started on apixaban on 7/11 and her last dose was on 7/17 at about 10:30 PM. Apixaban was stopped in anticipation of patient needing surgery for small bowel obstruction.  She pulled NGT out 7/18 pm and had BM this morning; starting clears if passes speech eval. aPTT is just  above goal at 87 sec, heparin level 1.34. No bleeding noted, Hgb and platelets are stable.  Goal of Therapy:  Heparin level 0.3-0.5 units/ml aPTT 66-85 seconds Monitor platelets by anticoagulation protocol: Yes   Plan:  Decrease heparin drip to 900 units/hr 8 hr aPTT Monitor heparin level and aPTT levels until correlated  Continue to monitor H&H and platelets F/U resuming apixaban as able to take Potter, PharmD, BCPS Clinical Pharmacist Clinical phone for 02/09/2018 until 3p is [phone] Please check AMION for all Pharmacist numbers by unit 02/09/2018 10:03 AM

## 2018-02-09 NOTE — Progress Notes (Signed)
Physical Therapy Treatment Patient Details Name: Kaitlyn Bauer MRN: [id number] DOB: 01-15-1922 Today's Date: 02/09/2018    History of Present Illness Kaitlyn Bauer is a 84 y.o. female with history of HTN and AF not on AC presenting to [facility] with aphasia and facial droop. famiy refused tPA. Improved upon arrival to [facility] so IR not indicated. Multiple left MCA infarcts embolic secondary to known atrial fibrillation     PT Comments    Patient agreeable to participate in therapy despite feeling fatigued today. Pt needed increased time for activities but not more assistance than previous session.  Current plan remains appropriate.   Follow Up Recommendations  Home health PT;Supervision/Assistance - 24 hour     Equipment Recommendations  None recommended by PT    Recommendations for Other Services       Precautions / Restrictions Precautions Precautions: Fall Restrictions Weight Bearing Restrictions: No    Mobility  Bed Mobility Overal bed mobility: Modified Independent             General bed mobility comments: increased time and effort; use of rail  Transfers Overall transfer level: Needs assistance Equipment used: Rolling walker (2 wheeled) Transfers: Sit to/from Stand Sit to Stand: Supervision         General transfer comment: supervision for safety; safe hand placement demonstrated  Ambulation/Gait Ambulation/Gait assistance: Min guard Gait Distance (Feet): 200 Feet Assistive device: Rolling walker (2 wheeled) Gait Pattern/deviations: Step-through pattern;Decreased stride length;Trunk flexed Gait velocity: decreased   General Gait Details: cues for proximity to RW; slow, steady gait    Stairs             Wheelchair Mobility    Modified Rankin (Stroke Patients Only)       Balance Overall balance assessment: Needs assistance Sitting-balance support: No upper extremity supported;Feet supported Sitting balance-Leahy Scale: Good      Standing balance support: Bilateral upper extremity supported;During functional activity Standing balance-Leahy Scale: Fair                              Cognition Arousal/Alertness: Awake/alert Behavior During Therapy: WFL for tasks assessed/performed Overall Cognitive Status: Within Functional Limits for tasks assessed                                        Exercises      General Comments        Pertinent Vitals/Pain Pain Assessment: No/denies pain    Home Living                      Prior Function            PT Goals (current goals can now be found in the care plan section) Acute Rehab PT Goals Patient Stated Goal: to go home PT Goal Formulation: With patient Time For Goal Achievement: 02/16/18 Potential to Achieve Goals: Good Progress towards PT goals: Progressing toward goals    Frequency    Min 4X/week      PT Plan Current plan remains appropriate    Co-evaluation              AM-PAC PT "6 Clicks" Daily Activity  Outcome Measure  Difficulty turning over in bed (including adjusting bedclothes, sheets and blankets)?: A Little Difficulty moving from lying on back to sitting on the side of  the bed? : A Little Difficulty sitting down on and standing up from a chair with arms (e.g., wheelchair, bedside commode, etc,.)?: Unable Help needed moving to and from a bed to chair (including a wheelchair)?: A Little Help needed walking in hospital room?: A Little Help needed climbing 3-5 steps with a railing? : A Little 6 Click Score: 16    End of Session Equipment Utilized During Treatment: Gait belt Activity Tolerance: Patient tolerated treatment well Patient left: with call bell/phone within reach;in bed;with bed alarm set Nurse Communication: Mobility status PT Visit Diagnosis: Unsteadiness on feet (R26.81);Other abnormalities of gait and mobility (R26.89);Muscle weakness (generalized) (M62.81);History of falling  (Z91.81)     Time: 1595-3967 PT Time Calculation (min) (ACUTE ONLY): 30 min  Charges:  $Gait Training: 8-22 mins $Therapeutic Activity: 8-22 mins                    G Codes:       Earney Navy, PTA Pager: [phone]     Darliss Cheney 02/09/2018, 12:08 PM

## 2018-02-10 LAB — GLUCOSE, CAPILLARY
GLUCOSE-CAPILLARY: 117 mg/dL — AB (ref 70–99)
GLUCOSE-CAPILLARY: 129 mg/dL — AB (ref 70–99)
GLUCOSE-CAPILLARY: 143 mg/dL — AB (ref 70–99)
Glucose-Capillary: 105 mg/dL — ABNORMAL HIGH (ref 70–99)
Glucose-Capillary: 111 mg/dL — ABNORMAL HIGH (ref 70–99)
Glucose-Capillary: 137 mg/dL — ABNORMAL HIGH (ref 70–99)
Glucose-Capillary: 140 mg/dL — ABNORMAL HIGH (ref 70–99)
Glucose-Capillary: 143 mg/dL — ABNORMAL HIGH (ref 70–99)

## 2018-02-10 LAB — BASIC METABOLIC PANEL
ANION GAP: 10 (ref 5–15)
BUN: 17 mg/dL (ref 8–23)
CHLORIDE: 111 mmol/L (ref 98–111)
CO2: 20 mmol/L — ABNORMAL LOW (ref 22–32)
Calcium: 7.8 mg/dL — ABNORMAL LOW (ref 8.9–10.3)
Creatinine, Ser: 0.89 mg/dL (ref 0.44–1.00)
GFR calc Af Amer: >60 mL/min (ref >60)
GFR calc non Af Amer: 54 mL/min — ABNORMAL LOW (ref >60)
Glucose, Bld: 156 mg/dL — ABNORMAL HIGH (ref 70–99)
POTASSIUM: 4.3 mmol/L (ref 3.5–5.1)
SODIUM: 141 mmol/L (ref 135–145)

## 2018-02-10 LAB — CBC
HCT: 27.6 % — ABNORMAL LOW (ref 36.0–46.0)
Hemoglobin: 8.6 g/dL — ABNORMAL LOW (ref 12.0–15.0)
MCH: 31 pg (ref 26.0–34.0)
MCHC: 31.2 g/dL (ref 30.0–36.0)
MCV: 99.6 fL (ref 78.0–100.0)
Platelets: 274 10*3/uL (ref 150–400)
RBC: 2.77 MIL/uL — ABNORMAL LOW (ref 3.87–5.11)
RDW: 15.3 % (ref 11.5–15.5)
WBC: 33.4 10*3/uL — ABNORMAL HIGH (ref 4.0–10.5)

## 2018-02-10 LAB — HEPARIN LEVEL (UNFRACTIONATED): HEPARIN UNFRACTIONATED: 0.86 [IU]/mL — AB (ref 0.30–0.70)

## 2018-02-10 LAB — APTT: APTT: 124 s — AB (ref 24–36)

## 2018-02-10 MED ORDER — TRAMADOL HCL 50 MG PO TABS
50.0000 mg | ORAL_TABLET | Freq: Four times a day (QID) | ORAL | Status: DC | PRN
  Administered 2018-02-10: 50 mg via ORAL
  Filled 2018-02-10 (×2): qty 1

## 2018-02-10 MED ORDER — HEPARIN (PORCINE) IN NACL 100-0.45 UNIT/ML-% IJ SOLN
700.0000 [IU]/h | INTRAMUSCULAR | Status: DC
  Administered 2018-02-10: 700 [IU]/h via INTRAVENOUS

## 2018-02-10 MED ORDER — ENSURE ENLIVE PO LIQD
237.0000 mL | Freq: Two times a day (BID) | ORAL | Status: DC
  Administered 2018-02-10 – 2018-02-11 (×3): 237 mL via ORAL

## 2018-02-10 MED ORDER — VITAMIN C 500 MG PO TABS
250.0000 mg | ORAL_TABLET | Freq: Two times a day (BID) | ORAL | Status: DC
  Administered 2018-02-10 – 2018-02-11 (×3): 250 mg via ORAL
  Filled 2018-02-10 (×2): qty 1

## 2018-02-10 MED ORDER — APIXABAN 2.5 MG PO TABS
2.5000 mg | ORAL_TABLET | Freq: Two times a day (BID) | ORAL | Status: DC
  Administered 2018-02-10 – 2018-02-11 (×2): 2.5 mg via ORAL
  Filled 2018-02-10 (×2): qty 1

## 2018-02-10 MED ORDER — TRAMADOL HCL 50 MG PO TABS
50.0000 mg | ORAL_TABLET | Freq: Once | ORAL | Status: AC
  Administered 2018-02-10: 50 mg via ORAL
  Filled 2018-02-10: qty 1

## 2018-02-10 MED ORDER — APIXABAN 2.5 MG PO TABS: 2.5000 mg | ORAL_TABLET | Freq: Two times a day (BID) | ORAL | Status: DC

## 2018-02-10 MED ORDER — BOOST / RESOURCE BREEZE PO LIQD CUSTOM
1.0000 | Freq: Three times a day (TID) | ORAL | Status: DC
  Administered 2018-02-10 – 2018-02-11 (×3): 1 via ORAL

## 2018-02-10 NOTE — Progress Notes (Signed)
Initial Nutrition Assessment  DOCUMENTATION CODES:   Not applicable  INTERVENTION:   Ensure Enlive po BID, each supplement provides 350 kcal and 20 grams of protein  Magic cup TID with meals, each supplement provides 290 kcal and 9 grams of protein  MVI daily  Vitamin C 274m po BID  NUTRITION DIAGNOSIS:   Inadequate oral intake related to acute illness as evidenced by meal completion < 50%.  GOAL:   Patient will meet greater than or equal to 90% of their needs  MONITOR:   PO intake, Supplement acceptance, Labs, Weight trends, Skin, I & O's  REASON FOR ASSESSMENT:   Consult Poor PO  ASSESSMENT:   84year old female with past medical history of hypertension was admitted for multifocal acute left MCA territory nonhemorrhagic infarcts.    Met with pt in room today. Pt is a poor historian and seems confused at times so it is difficult to gather any nutrition related history. Per chart review, it appears pt was independent with her ADLs at baseline. Per chart, pt is weight stable so RD suspects pt was eating fairly well pta. In hospital, pt has been eating ~25% of her meals. Pt was having issues with nausea/vomiting earlier today and there was some concern for possible ileus vs SBO. Per RN, GI issues has resolved and pt has been able to eat with no nausea and has advanced to a GI soft diet. RD will order supplements to help pt meet her estimated needs. Pt noted to have ecchymosis on bilateral arms; recommend vitamin C supplementation. Consider Cortrak tube placement if po intake does not improve.   Medications reviewed and include: MVI, NaCl _0 /hr  Labs reviewed: wbc- 33.4(H), Hgb 8.6(L), Hct 27.6(L) cbgs- 143, 143 x 24 hrs  NUTRITION - FOCUSED PHYSICAL EXAM:    Most Recent Value  Orbital Region  No depletion  Upper Arm Region  No depletion  Thoracic and Lumbar Region  No depletion  Buccal Region  No depletion  Temple Region  No depletion  Clavicle Bone Region  No  depletion  Clavicle and Acromion Bone Region  No depletion  Scapular Bone Region  No depletion  Dorsal Hand  No depletion  Patellar Region  No depletion  Anterior Thigh Region  No depletion  Posterior Calf Region  No depletion  Edema (RD Assessment)  Mild  Hair  Reviewed  Eyes  Reviewed  Mouth  Reviewed  Skin  Reviewed [[email or web address]]  Nails  Reviewed     Diet Order:   Diet Order           DIET SOFT Room service appropriate? Yes; Fluid consistency: Thin  Diet effective now         EDUCATION NEEDS:   Not appropriate for education at this time  Skin:  Skin Assessment: Reviewed RN Assessment(ecchymosis )  Last BM:  7/19- type 7  Height:   Ht Readings from Last 1 Encounters:  02/01/18 _1  (1.549 m)    Weight:   Wt Readings from Last 1 Encounters:  02/10/18 145 lb 4.8 oz (65.9 kg)    Ideal Body Weight:  47.7 kg  BMI:  Body mass index is 27.45 kg/m.  Estimated Nutritional Needs:   Kcal:  1300-1500kcal/day   Protein:  60-72g/day   Fluid:  >1.5L/day   CKoleen DistanceMS, RD, LDN Pager #- [phone]#- [phone] Hours Pager: [phone]

## 2018-02-10 NOTE — Progress Notes (Signed)
Patient ID: Kaitlyn Bauer, female   DOB: [birth year], 84 y.o.   MRN: [id number]       Subjective: Pt is difficult to tell how she is truly doing.  She got moved back from the chair to the bed and stated she was going to throw up, but then once laying down says "i'm not sick on my stomach."  Her daughter says she complaining of belly and breast pain.  But then the patient states she has no abdominal pain.  She mostly just says she has no appetite and doesn't want to eat anything.  Barely touched her clear liquids.    Objective: Vital signs in last 24 hours: Temp:  [97.1 F (36.2 C)-98.2 F (36.8 C)] 97.6 F (36.4 C) (07/20 0837) Pulse Rate:  [68-102] 102 (07/20 0837) Resp:  [17-21] 21 (07/20 0300) BP: (126-177)/(58-77) 126/58 (07/20 0837) SpO2:  [92 %-100 %] 95 % (07/20 0300) Last BM Date: 02/09/18  Intake/Output from previous day: 07/19 0701 - 07/20 0700 In: 2807.5 [I.V.:2807.5] Out: -  Intake/Output this shift: No intake/output data recorded.  PE: Abd: soft, mild bloating, but has good BS, completely NT  Lab Results:  Recent Labs    02/09/18 0341 02/10/18 0849  WBC 23.2* 33.4*  HGB 11.5* 8.6*  HCT 36.0 27.6*  PLT 249 274   BMET Recent Labs    02/09/18 0341  NA 144  K 4.2  CL 111  CO2 24  GLUCOSE 112*  BUN 25*  CREATININE 1.05*  CALCIUM 8.2*   PT/INR No results for input(s): LABPROT, INR in the last 72 hours. CMP     Component Value Date/Time   NA 144 02/09/2018 0341   K 4.2 02/09/2018 0341   CL 111 02/09/2018 0341   CO2 24 02/09/2018 0341   GLUCOSE 112 (H) 02/09/2018 0341   BUN 25 (H) 02/09/2018 0341   CREATININE 1.05 (H) 02/09/2018 0341   CALCIUM 8.2 (L) 02/09/2018 0341   PROT 6.6 02/20/2018 1712   ALBUMIN 3.6 02/17/2018 1712   AST 24 02/19/2018 1712   ALT 10 01/28/2018 1712   ALKPHOS 78 01/23/2018 1712   BILITOT 0.6 02/19/2018 1712   GFRNONAA 44 (L) 02/09/2018 0341   GFRAA 51 (L) 02/09/2018 0341   Lipase  No results found for:  LIPASE     Studies/Results: Dg Abd 1 View  Result Date: 02/08/2018 CLINICAL DATA:  NG tube placement EXAM: ABDOMEN - 1 VIEW COMPARISON:  None. FINDINGS: NG tube tip is in the mid to distal stomach. IMPRESSION: NG tube tip in the mid to distal stomach. Electronically Signed   By: Rolm Baptise M.D.   On: 02/08/2018 14:01   Dg Abd Portable 1v-small Bowel Obstruction Protocol-initial, 8 Hr Delay  Result Date: 02/09/2018 CLINICAL DATA:  Small-bowel obstruction, follow-up 8 hour imaging EXAM: PORTABLE ABDOMEN - 1 VIEW COMPARISON:  02/07/2018 FINDINGS: Contrast is noted in the expected location of the gastric fundus proximal body. Mild contrast filled distention of small bowel loops is identified however there is contrast that has passed into the colon consistent with a partial SBO. No free air is identified. Cholecystectomy clips are present. IMPRESSION: Enteric contrast is noted in the descending colon. Contrast distended small bowel loops persist. Findings would suggest a partial SBO. Electronically Signed   By: Ashley Royalty M.D.   On: 02/09/2018 03:51    Anti-infectives: Anti-infectives (From admission, onward)   Start     Dose/Rate Route Frequency Ordered Stop   02/03/18 2200  sulfamethoxazole-trimethoprim (  BACTRIM DS,SEPTRA DS) 800-160 MG per tablet 1 tablet     1 tablet Oral Every 12 hours 02/03/18 1618 02/06/18 2159   02/01/18 2200  doxycycline (VIBRA-TABS) tablet 100 mg  Status:  Discontinued     100 mg Oral 2 times daily 02/01/18 1626 02/06/18 1406       Assessment/Plan HTN Atrial fibrillation HLD Embolic CVA 5/32/02 CLL with leukocytosis- diagnosed during this hospitalization R thyroid nodule 15 mm - Outpatient [place] L breast masses -outpatient follow up w/Dr. Rogue Bussing at [facility] CKD stage III with L renal mass and atrophy  SBO vs. Ileus -appears to be more ileus in nature.  Her contrast from either her scan or from SBO protocol is in the colon indicating that she does not  have an obstruction and her gut is working. -passed swallow.  On clears, but not taking in anything as she has no appetite and doesn't want to eat. -from a surgical standpoint, her diet can be advance as tolerates.  Try supplements etc to help with nutrition, but not sure the patient is going to take much in.  Will defer further nutritional care, long-term, to primary service.   -no surgical indications.  We will sign off.  FEN: clears, adv VTE: SCDs, eliquis--> heparin ID: no current abx Disposition: diet can be advanced as tolerates, add supplements, defer to primary team in long-term nutritional discussions and needs     LOS: 10 days    Henreitta Cea , [facility] 02/10/2018, 9:48 AM Pager: [phone]

## 2018-02-10 NOTE — Progress Notes (Signed)
PROGRESS NOTE    Kaitlyn Bauer  LFY:[id number] DOB: Sep 06, 1921 DOA: 01/26/2018 PCP: Baxter Hire, MD   Brief Narrative: Patient is a 84 year old female with past medical history of hypertension was admitted for multifocal acute left MCA territory nonhemorrhagic infarcts.  She developed nausea and vomiting during the hospitalization.  CT imaging was concerning for small bowel obstruction.  Hospitalist service was consulted for ileus and finally took over the case.  General surgery consulted  for possible worsening of small bowel obstruction.  NG tube has been taken out .Diet advanced today.  Assessment & Plan:   Principal Problem:   Stroke (cerebrum) ([place]) Active Problems:   Essential hypertension   Adynamic ileus (HCC)   Breast mass in female x 2, left   Leukocytosis, like d/t CLL   Left renal atrophy   Hyperglycemia   Chronic kidney disease, stage 3, mod decreased GFR (HCC)   Thyroid nodule, right   Abdominal pain   Atrial fibrillation (HCC)   Hyperlipidemia   Ileus (HCC)   Small bowel obstruction (HCC)  Ileus: Surgery was  following.  NG out .Diet will be advanced.  Abdomen is softer today.  Does not complain of any abdominal pain.  No nausea or vomiting.  Have requested for nutrition consult for her poor appetite.  Acute ischemic stroke: Multiple left MCA infarcts, likely embolic secondary to known atrial fibrillation not on anticoagulation .Neurology was following.  On Eliquis and statin.  Afib: CHA2DS2-VASc of 4.  On Eliquis.  Hypertension: Blood pressure stable.  Continue to monitor.  Left breast mass: Likely breast cancer.She will follow-up with oncology Dr. Rogue Bussing in [place]. Neurology sent Sent epic message and left VM at the office.  Complains of left breast pain.  Continue tramadol as needed.  Leukocytosis:Most likely chronic.History of CLL which could be contributing. Cultures are negative.  Chest x-ray did not show any infiltrate.Follow up with oncology as  an outpatient.  Left renal mass/atrophy: Outpatient evaluation by urology could be considered.  Hyperglycemia: Hemoglobin A1c was a 5.9.  CKD stage 3: Stable  Right thyroid nodule: TSH 2.6.  Consider outpatient work-up.  Deconditioning/debility /stroke: Patient evaluated by  PT and OT and recommended home health physical therapy.   DVT prophylaxis: eliquis Code Status: DNR Family Communication: None present at the bedside Disposition: Home with home health likely tomorrow Antimicrobials:None  Subjective: Patient seen and examined the bedside this afternoon.  She looks more comfortable today.  Abdomen is soft and nontender.  She continues to have very poor appetite.  Complains of left breast pain.  Objective: Vitals:   02/10/18 0000 02/10/18 0300 02/10/18 0837 02/10/18 1112  BP: 135/63 130/61 (!) 126/58 (!) 124/56  Pulse: 96  (!) 102 97  Resp: 17 (!) 21  14  Temp: (!) 97.1 F (36.2 C) 97.8 F (36.6 C) 97.6 F (36.4 C)   TempSrc: Oral Oral Oral   SpO2: 100% 95%  94%  Weight:      Height:        Intake/Output Summary (Last 24 hours) at 02/10/2018 1142 Last data filed at 02/10/2018 1136 Gross per 24 hour  Intake 3452.43 ml  Output -  Net 3452.43 ml   Filed Weights   01/26/2018 2210 02/01/18 0015  Weight: 67.3 kg (148 lb 5.9 oz) 60.4 kg (133 lb 2.5 oz)    Examination:  General exam: Appears calm and comfortable ,Not in distress, elderly female HEENT:PERRL,Oral mucosa moist, Ear/Nose normal on gross exam,HOH Respiratory system: Bilateral equal air entry,  normal vesicular breath sounds, no wheezes or crackles  Cardiovascular system: S1 & S2 heard, RRR. No JVD, murmurs, rubs, gallops or clicks. No pedal edema. Chest: Left breast mass Gastrointestinal system: Abdomen  Is  soft and no tendereness. No organomegaly or masses felt. Bowel sounds present Central nervous system: Alert but not fully oriented. No focal neurological deficits. Extremities: No edema, no clubbing  ,no cyanosis, distal peripheral pulses palpable. Skin: No rashes, lesions or ulcers,no icterus ,no pallor      Data Reviewed: I have personally reviewed following labs and imaging studies  CBC: Recent Labs  Lab 02/05/18 0730 02/06/18 0453 02/07/18 0504 02/08/18 0347 02/09/18 0341 02/10/18 0849  WBC 31.4* 20.6* 16.4* 32.8* 23.2* 33.4*  NEUTROABS 11.9* 7.0 6.1 11.5* 7.7  --   HGB 12.5 11.5* 11.2* 13.0 11.5* 8.6*  HCT 38.3 35.4* 34.2* 39.4 36.0 27.6*  MCV 94.6 95.9 95.5 95.2 96.3 99.6  PLT 241 223 186 291 249 527   Basic Metabolic Panel: Recent Labs  Lab 02/05/18 0730 02/06/18 0453 02/07/18 0504 02/09/18 0341 02/10/18 0849  NA 140 142 139 144 141  K 3.8 3.7 3.7 4.2 4.3  CL 105 107 109 111 111  CO2 27 28 25 24  20*  GLUCOSE 132* 117* 103* 112* 156*  BUN 25* 26* 23 25* 17  CREATININE 1.20* 1.20* 1.00 1.05* 0.89  CALCIUM 8.3* 8.2* 8.3* 8.2* 7.8*   GFR: Estimated Creatinine Clearance: 32.9 mL/min (by C-G formula based on SCr of 0.89 mg/dL). Liver Function Tests: No results for input(s): AST, ALT, ALKPHOS, BILITOT, PROT, ALBUMIN in the last 168 hours. No results for input(s): LIPASE, AMYLASE in the last 168 hours. No results for input(s): AMMONIA in the last 168 hours. Coagulation Profile: No results for input(s): INR, PROTIME in the last 168 hours. Cardiac Enzymes: No results for input(s): CKTOTAL, CKMB, CKMBINDEX, TROPONINI in the last 168 hours. BNP (last 3 results) No results for input(s): PROBNP in the last 8760 hours. HbA1C: No results for input(s): HGBA1C in the last 72 hours. CBG: Recent Labs  Lab 02/09/18 0738 02/09/18 1150 02/09/18 1628 02/09/18 2054 02/10/18 0014  GLUCAP 86 78 89 104* 105*   Lipid Profile: No results for input(s): CHOL, HDL, LDLCALC, TRIG, CHOLHDL, LDLDIRECT in the last 72 hours. Thyroid Function Tests: No results for input(s): TSH, T4TOTAL, FREET4, T3FREE, THYROIDAB in the last 72 hours. Anemia Panel: No results for input(s):  VITAMINB12, FOLATE, FERRITIN, TIBC, IRON, RETICCTPCT in the last 72 hours. Sepsis Labs: No results for input(s): PROCALCITON, LATICACIDVEN in the last 168 hours.  Recent Results (from the past 240 hour(s))  MRSA PCR Screening     Status: None   Collection Time: 02/01/18 12:23 AM  Result Value Ref Range Status   MRSA by PCR NEGATIVE NEGATIVE Final    Comment:        The GeneXpert MRSA Assay (FDA approved for NASAL specimens only), is one component of a comprehensive MRSA colonization surveillance program. It is not intended to diagnose MRSA infection nor to guide or monitor treatment for MRSA infections. Performed at [facility], [place] [address], [place], [place] [address]   Culture, Urine     Status: None   Collection Time: 02/02/18  5:48 PM  Result Value Ref Range Status   Specimen Description URINE, CLEAN CATCH  Final   Special Requests NONE  Final   Culture   Final    NO GROWTH Performed at [facility], [place],  [place] [address]    Report Status 02/03/2018 FINAL  Final  Culture, blood (Routine X 2) w Reflex to ID Panel     Status: None   Collection Time: 02/04/18  8:40 AM  Result Value Ref Range Status   Specimen Description BLOOD RIGHT HAND  Final   Special Requests   Final    BOTTLES DRAWN AEROBIC AND ANAEROBIC Blood Culture adequate volume   Culture   Final    NO GROWTH 5 DAYS Performed at [facility], [place] [address], [place], [place] [address]    Report Status 02/09/2018 FINAL  Final  Culture, blood (Routine X 2) w Reflex to ID Panel     Status: None   Collection Time: 02/04/18  8:41 AM  Result Value Ref Range Status   Specimen Description BLOOD RIGHT HAND  Final   Special Requests   Final    BOTTLES DRAWN AEROBIC AND ANAEROBIC Blood Culture adequate volume   Culture   Final    NO GROWTH 5 DAYS Performed at [facility], [place] [address] [place],  [address]    Report Status 02/09/2018 FINAL  Final          Radiology Studies: Dg Abd 1 View  Result Date: 02/08/2018 CLINICAL DATA:  NG tube placement EXAM: ABDOMEN - 1 VIEW COMPARISON:  None. FINDINGS: NG tube tip is in the mid to distal stomach. IMPRESSION: NG tube tip in the mid to distal stomach. Electronically Signed   By: Rolm Baptise M.D.   On: 02/08/2018 14:01   Dg Abd Portable 1v-small Bowel Obstruction Protocol-initial, 8 Hr Delay  Result Date: 02/09/2018 CLINICAL DATA:  Small-bowel obstruction, follow-up 8 hour imaging EXAM: PORTABLE ABDOMEN - 1 VIEW COMPARISON:  02/07/2018 FINDINGS: Contrast is noted in the expected location of the gastric fundus proximal body. Mild contrast filled distention of small bowel loops is identified however there is contrast that has passed into the colon consistent with a partial SBO. No free air is identified. Cholecystectomy clips are present. IMPRESSION: Enteric contrast is noted in the descending colon. Contrast distended small bowel loops persist. Findings would suggest a partial SBO. Electronically Signed   By: Ashley Royalty M.D.   On: 02/09/2018 03:51        Scheduled Meds: . amLODipine  10 mg Oral Daily  . apixaban  2.5 mg Oral BID  . atorvastatin  10 mg Oral q1800  . feeding supplement  1 Container Oral TID BM  . hydrALAZINE  25 mg Oral TID  . multivitamin with minerals  1 tablet Oral QAC supper   Continuous Infusions: . sodium chloride 75 mL/hr at 02/10/18 1136  . sodium chloride Stopped (02/01/18 0843)     LOS: 10 days    Time spent: 25 mins.     Shelly Coss, MD [facility] Pager [phone]  If 7PM-7AM, please contact night-coverage [email or web address] Password [facility] 02/10/2018, 11:42 AM

## 2018-02-10 NOTE — Progress Notes (Signed)
[place] for Heparin Indication: atrial fibrillation and stroke  Allergies  Allergen Reactions  . Penicillins Hives    Has patient had a PCN reaction causing immediate rash, facial/tongue/throat swelling, SOB or lightheadedness with hypotension: Unknown Has patient had a PCN reaction causing severe rash involving mucus membranes or skin necrosis: Unknown Has patient had a PCN reaction that required hospitalization: Unknown Has patient had a PCN reaction occurring within the last 10 years: Unknown If all of the above answers are "NO", then may proceed with Cephalosporin use.    Patient Measurements: Height: 5\' 1"  (154.9 cm) Weight: 133 lb 2.5 oz (60.4 kg) IBW/kg (Calculated) : 47.8 Heparin Dosing Weight: 60 kg  Vital Signs: Temp: 97.5 F (36.4 C) (07/19 2251) Temp Source: Oral (07/19 2251) BP: 128/63 (07/19 2251) Pulse Rate: 102 (07/19 2251)  Labs: Recent Labs    02/07/18 0504 02/08/18 0347  02/09/18 0341 02/09/18 0645 02/09/18 1931 02/09/18 2240  HGB 11.2* 13.0  --  11.5*  --   --   --   HCT 34.2* 39.4  --  36.0  --   --   --   PLT 186 291  --  249  --   --   --   APTT  --   --    < >  --  87* 186* 181*  HEPARINUNFRC  --   --   --   --  1.34*  --   --   CREATININE 1.00  --   --  1.05*  --   --   --    < > = values in this interval not displayed.    Estimated Creatinine Clearance: 27.9 mL/min (A) (by C-G formula based on SCr of 1.05 mg/dL (H)).   Medical History: Past Medical History:  Diagnosis Date  . Hypertension     Medications:  Scheduled:  . amLODipine  10 mg Oral Daily  . atorvastatin  10 mg Oral q1800  . hydrALAZINE  25 mg Oral TID  . multivitamin with minerals  1 tablet Oral QAC supper    Assessment: Patient is a 84 YO female with a past medical history of HTN and afib not on anticoagulation prior to admission being started on heparin for stroke and afib. Patient was started on apixaban on 7/11 and her last  dose was on 7/17 at about 10:30 PM. Apixaban was stopped in anticipation of patient needing surgery for small bowel obstruction.  7/20 AM update: aPTT was 186 tonight, it was re-drawn to confirm and the repeat level is still elevated at 181, no issues per RN.   Goal of Therapy:  Heparin level 0.3-0.5 units/ml aPTT 66-85 seconds Monitor platelets by anticoagulation protocol: Yes   Plan:  Hold heparin x 1 hr Re-start heparin at 700 units/hr at 0100 0900 aPTT/HL Monitor heparin level and aPTT levels until correlated  Continue to monitor H&H and platelets  Narda Bonds, PharmD, BCPS Clinical Pharmacist Phone: [phone]

## 2018-02-10 NOTE — Progress Notes (Signed)
New aPTT was drawn it was 181 I notified pharmacy.

## 2018-02-10 NOTE — Progress Notes (Signed)
2nd aPTT is 181 I notified Pharmacy.

## 2018-02-11 ENCOUNTER — Inpatient Hospital Stay (HOSPITAL_COMMUNITY): Payer: Medicare HMO

## 2018-02-11 LAB — CBC
HCT: 22.6 % — ABNORMAL LOW (ref 36.0–46.0)
Hemoglobin: 7.1 g/dL — ABNORMAL LOW (ref 12.0–15.0)
MCH: 31.3 pg (ref 26.0–34.0)
MCHC: 31.4 g/dL (ref 30.0–36.0)
MCV: 99.6 fL (ref 78.0–100.0)
Platelets: 258 10*3/uL (ref 150–400)
RBC: 2.27 MIL/uL — ABNORMAL LOW (ref 3.87–5.11)
RDW: 15.8 % — ABNORMAL HIGH (ref 11.5–15.5)
WBC: 34.6 10*3/uL — ABNORMAL HIGH (ref 4.0–10.5)

## 2018-02-11 LAB — GLUCOSE, CAPILLARY: Glucose-Capillary: 125 mg/dL — ABNORMAL HIGH (ref 70–99)

## 2018-02-11 MED ORDER — MORPHINE SULFATE (PF) 2 MG/ML IV SOLN
2.0000 mg | INTRAVENOUS | Status: DC | PRN
  Administered 2018-02-11: 2 mg via INTRAVENOUS
  Filled 2018-02-11: qty 1

## 2018-02-11 MED ORDER — LORAZEPAM 2 MG/ML IJ SOLN: 1.0000 mg | INTRAMUSCULAR | Status: DC | PRN

## 2018-02-11 MED ORDER — MORPHINE 100MG IN NS 100ML (1MG/ML) PREMIX INFUSION
2.0000 mg/h | INTRAVENOUS | Status: DC
  Administered 2018-02-11: 2 mg/h via INTRAVENOUS
  Filled 2018-02-11: qty 100

## 2018-02-11 MED ORDER — GLYCOPYRROLATE 0.2 MG/ML IJ SOLN
0.2000 mg | Freq: Two times a day (BID) | INTRAMUSCULAR | Status: DC
  Administered 2018-02-11: 0.2 mg via INTRAVENOUS
  Filled 2018-02-11: qty 1

## 2018-02-11 MED ORDER — MORPHINE SULFATE (PF) 2 MG/ML IV SOLN: 2.0000 mg | INTRAVENOUS | Status: DC | PRN

## 2018-02-11 MED ORDER — ONDANSETRON HCL 4 MG/2ML IJ SOLN
4.0000 mg | Freq: Four times a day (QID) | INTRAMUSCULAR | Status: DC | PRN
  Administered 2018-02-11: 4 mg via INTRAVENOUS

## 2018-02-11 NOTE — Progress Notes (Addendum)
PROGRESS NOTE    Kaitlyn Bauer  PFX:[id number] DOB: 12/17/1921 DOA: 02/18/2018 PCP: Baxter Hire, MD   Brief Narrative: Patient is a 84 year old female with past medical history of hypertension was admitted for multifocal acute left MCA territory nonhemorrhagic infarcts.  She developed nausea and vomiting during the hospitalization.  CT imaging was concerning for small bowel obstruction.  Hospitalist service was consulted for ileus and finally took over the case.  General surgery consulted  for possible worsening of small bowel obstruction.  NG tube has been taken out . Diet was advanced but she continues to remain weak, lethargic and has very poor appetite.  She is rapidly going downhill .She also has advanced left-sided breast cancer,CLL. After extensive discussion with her daughter [name], she has expressed her desire to make her comfortable and pursue for residential hospice placement.  Social worker consulted.  Assessment & Plan:   Principal Problem:   Stroke (cerebrum) ([place]) Active Problems:   Essential hypertension   Adynamic ileus (HCC)   Breast mass in female x 2, left   Leukocytosis, like d/t CLL   Left renal atrophy   Hyperglycemia   Chronic kidney disease, stage 3, mod decreased GFR (HCC)   Thyroid nodule, right   Abdominal pain   Atrial fibrillation (HCC)   Hyperlipidemia   Ileus (HCC)   Small bowel obstruction (HCC)  Ileus: Surgery was  following.  NG out .Diet  advanced.    Does not complain of any abdominal pain.  No nausea or vomiting.  Have requested for nutrition consult for her poor appetite.  Acute ischemic stroke: Multiple left MCA infarcts, likely embolic secondary to known atrial fibrillation not on anticoagulation .Neurology was following.  On Eliquis and statin.  Afib: CHA2DS2-VASc of 4.  On Eliquis.  Hypertension: Blood pressure stable.    Left breast mass: Likely advanced breast cancer.There was  plan for follow up with oncology Dr. Rogue Bussing in  [place].  Now she is a hospice candidate.Continue tramadol as needed.  Leukocytosis:Most likely chronic.History of CLL which could be contributing. Cultures are negative.  Chest x-ray did not show any infiltrate.  Left renal mass/atrophy: Outpatient evaluation by urology was planned.Now hospice candidate.  Hyperglycemia: Hemoglobin A1c was a 5.9.  CKD stage 3: Stable  Right thyroid nodule: TSH 2.6.   Deconditioning/debility /stroke: Patient evaluated by  PT and OT and recommended home health physical therapy.   DVT prophylaxis: eliquis Code Status: DNR Family Communication: daughter present at the bedside  disposition: Residential Hospice Antimicrobials:None  Subjective: Patient seen and examined the bedside this morning.  Appears very pale.  Very weak and continues to have poor appetite.  Remains confused and somnolent.  Objective: Vitals:   02/10/18 1932 02/10/18 2349 02/11/18 0410 02/11/18 0857  BP: (!) 127/59 (!) 115/48 (!) 100/40 (!) 128/57  Pulse: (!) 103  (!) 106 (!) 102  Resp: 19 16 18 16   Temp: (!) 97.4 F (36.3 C) 97.6 F (36.4 C) 97.9 F (36.6 C) 98.2 F (36.8 C)  TempSrc: Oral Oral Oral   SpO2: 94% 93% 94% 92%  Weight:      Height:        Intake/Output Summary (Last 24 hours) at 02/11/2018 1021 Last data filed at 02/10/2018 1500 Gross per 24 hour  Intake 899.83 ml  Output -  Net 899.83 ml   Filed Weights   02/02/2018 2210 02/01/18 0015 02/10/18 1440  Weight: 67.3 kg (148 lb 5.9 oz) 60.4 kg (133 lb 2.5 oz) 65.9 kg (  145 lb 4.8 oz)    Examination:  General exam: Chronically ill elderly female HEENT:PERRL,Oral mucosa moist, Ear/Nose normal on gross exam,HOH Respiratory system: Bilateral equal air entry, normal vesicular breath sounds, no wheezes or crackles  Cardiovascular system: S1 & S2 heard, RRR. No JVD, murmurs, rubs, gallops or clicks. No pedal edema. Chest: Left breast mass Gastrointestinal system: Abdomen  Is  soft and no tendereness. No  organomegaly or masses felt. Bowel sounds present Central nervous system: Somnolent, not oriented Extremities: No edema, no clubbing ,no cyanosis, distal peripheral pulses palpable. Skin: No rashes, lesions or ulcers,no icterus ,no pallor      Data Reviewed: I have personally reviewed following labs and imaging studies  CBC: Recent Labs  Lab 02/05/18 0730 02/06/18 0453 02/07/18 0504 02/08/18 0347 02/09/18 0341 02/10/18 0849 02/11/18 0325  WBC 31.4* 20.6* 16.4* 32.8* 23.2* 33.4* 34.6*  NEUTROABS 11.9* 7.0 6.1 11.5* 7.7  --   --   HGB 12.5 11.5* 11.2* 13.0 11.5* 8.6* 7.1*  HCT 38.3 35.4* 34.2* 39.4 36.0 27.6* 22.6*  MCV 94.6 95.9 95.5 95.2 96.3 99.6 99.6  PLT 241 223 186 291 249 274 619   Basic Metabolic Panel: Recent Labs  Lab 02/05/18 0730 02/06/18 0453 02/07/18 0504 02/09/18 0341 02/10/18 0849  NA 140 142 139 144 141  K 3.8 3.7 3.7 4.2 4.3  CL 105 107 109 111 111  CO2 27 28 25 24  20*  GLUCOSE 132* 117* 103* 112* 156*  BUN 25* 26* 23 25* 17  CREATININE 1.20* 1.20* 1.00 1.05* 0.89  CALCIUM 8.3* 8.2* 8.3* 8.2* 7.8*   GFR: Estimated Creatinine Clearance: 34.3 mL/min (by C-G formula based on SCr of 0.89 mg/dL). Liver Function Tests: No results for input(s): AST, ALT, ALKPHOS, BILITOT, PROT, ALBUMIN in the last 168 hours. No results for input(s): LIPASE, AMYLASE in the last 168 hours. No results for input(s): AMMONIA in the last 168 hours. Coagulation Profile: No results for input(s): INR, PROTIME in the last 168 hours. Cardiac Enzymes: No results for input(s): CKTOTAL, CKMB, CKMBINDEX, TROPONINI in the last 168 hours. BNP (last 3 results) No results for input(s): PROBNP in the last 8760 hours. HbA1C: No results for input(s): HGBA1C in the last 72 hours. CBG: Recent Labs  Lab 02/10/18 1150 02/10/18 1613 02/10/18 1928 02/10/18 2358 02/11/18 0458  GLUCAP 143* 137* 140* 117* 125*   Lipid Profile: No results for input(s): CHOL, HDL, LDLCALC, TRIG, CHOLHDL,  LDLDIRECT in the last 72 hours. Thyroid Function Tests: No results for input(s): TSH, T4TOTAL, FREET4, T3FREE, THYROIDAB in the last 72 hours. Anemia Panel: No results for input(s): VITAMINB12, FOLATE, FERRITIN, TIBC, IRON, RETICCTPCT in the last 72 hours. Sepsis Labs: No results for input(s): PROCALCITON, LATICACIDVEN in the last 168 hours.  Recent Results (from the past 240 hour(s))  Culture, Urine     Status: None   Collection Time: 02/02/18  5:48 PM  Result Value Ref Range Status   Specimen Description URINE, CLEAN CATCH  Final   Special Requests NONE  Final   Culture   Final    NO GROWTH Performed at [facility], [address] [place], [place] [address]    Report Status 02/03/2018 FINAL  Final  Culture, blood (Routine X 2) w Reflex to ID Panel     Status: None   Collection Time: 02/04/18  8:40 AM  Result Value Ref Range Status   Specimen Description BLOOD RIGHT HAND  Final   Special Requests   Final  BOTTLES DRAWN AEROBIC AND ANAEROBIC Blood Culture adequate volume   Culture   Final    NO GROWTH 5 DAYS Performed at [facility], [place] [address], [place], [place] [address]    Report Status 02/09/2018 FINAL  Final  Culture, blood (Routine X 2) w Reflex to ID Panel     Status: None   Collection Time: 02/04/18  8:41 AM  Result Value Ref Range Status   Specimen Description BLOOD RIGHT HAND  Final   Special Requests   Final    BOTTLES DRAWN AEROBIC AND ANAEROBIC Blood Culture adequate volume   Culture   Final    NO GROWTH 5 DAYS Performed at [facility], [place] [address], [place], [place] [address]    Report Status 02/09/2018 FINAL  Final         Radiology Studies: No results found.      Scheduled Meds: . amLODipine  10 mg Oral Daily  . apixaban  2.5 mg Oral Q12H  . atorvastatin  10 mg Oral q1800  . feeding supplement  1 Container Oral TID BM  . feeding supplement (ENSURE ENLIVE)  237 mL Oral BID BM  . hydrALAZINE  25 mg Oral TID  .  multivitamin with minerals  1 tablet Oral QAC supper  . vitamin C  250 mg Oral BID   Continuous Infusions: . sodium chloride 75 mL/hr at 02/11/18 0855  . sodium chloride Stopped (02/01/18 0843)     LOS: 11 days    Time spent: 35 mins.     Shelly Coss, MD [facility] Pager [phone]  If 7PM-7AM, please contact night-coverage [email or web address] Password [facility] 02/11/2018, 10:21 AM

## 2018-02-11 NOTE — Progress Notes (Signed)
Patient ID: Kaitlyn Bauer, female   DOB: March 29, 1922, 84 y.o.   MRN: [id number]       Subjective: Patient is speaking in jibberish this morning.  I have no idea what she is saying most of the time.  She does clear up for just a few seconds and tells me she is having a lot of BMs but the nurse states that is not the case.  She ate only 3 bites of food this morning.    Objective: Vital signs in last 24 hours: Temp:  [97.4 F (36.3 C)-98.2 F (36.8 C)] 98.2 F (36.8 C) (07/21 0857) Pulse Rate:  [95-107] 102 (07/21 0857) Resp:  [16-19] 16 (07/21 0857) BP: (100-128)/(40-59) 128/57 (07/21 0857) SpO2:  [91 %-94 %] 92 % (07/21 0857) Weight:  [65.9 kg (145 lb 4.8 oz)] 65.9 kg (145 lb 4.8 oz) (07/20 1440) Last BM Date: 02/09/18  Intake/Output from previous day: 07/20 0701 - 07/21 0700 In: 899.8 [I.V.:899.8] Out: -  Intake/Output this shift: No intake/output data recorded.  PE: Abd: soft, but seems more distended today, tinkling BS noted, NT  Lab Results:  Recent Labs    02/10/18 0849 02/11/18 0325  WBC 33.4* 34.6*  HGB 8.6* 7.1*  HCT 27.6* 22.6*  PLT 274 258   BMET Recent Labs    02/09/18 0341 02/10/18 0849  NA 144 141  K 4.2 4.3  CL 111 111  CO2 24 20*  GLUCOSE 112* 156*  BUN 25* 17  CREATININE 1.05* 0.89  CALCIUM 8.2* 7.8*   PT/INR No results for input(s): LABPROT, INR in the last 72 hours. CMP     Component Value Date/Time   NA 141 02/10/2018 0849   K 4.3 02/10/2018 0849   CL 111 02/10/2018 0849   CO2 20 (L) 02/10/2018 0849   GLUCOSE 156 (H) 02/10/2018 0849   BUN 17 02/10/2018 0849   CREATININE 0.89 02/10/2018 0849   CALCIUM 7.8 (L) 02/10/2018 0849   PROT 6.6 01/28/2018 1712   ALBUMIN 3.6 02/18/2018 1712   AST 24 02/21/2018 1712   ALT 10 02/16/2018 1712   ALKPHOS 78 01/22/2018 1712   BILITOT 0.6 02/11/2018 1712   GFRNONAA 54 (L) 02/10/2018 0849   GFRAA >60 02/10/2018 0849   Lipase  No results found for: LIPASE     Studies/Results: Dg Abd  Acute W/chest  Result Date: 02/11/2018 CLINICAL DATA:  Small bowel obstruction. Best pictures obtainable due to pt condition. EXAM: DG ABDOMEN ACUTE W/ 1V CHEST COMPARISON:  Abdominal film 02/09/2018 FINDINGS: No evidence intraperitoneal free air beneath the diaphragms semi erect chest film. There is LEFT basilar atelectasis. Oral contrast is present within the ascending, descending colon, and rectum. Persistent dilated loops of small bowel to 3-3.5 cm. This is slightly more distended than 17/19/19. No air-fluid levels. IMPRESSION: 1. Oral contrast has progressed to the rectum. 2. Dilated of small bowel without air-fluid levels suggest ileus or partial small bowel obstruction. Bowel dilatation is not improved from 02/09/2018. 3. No intraperitoneal free air evident on semi upright exam Electronically Signed   By: Suzy Bouchard M.D.   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 Rogue Bussing at [facility] CKD stage III with L renal mass and atrophy  SBO vs. Ileus -despite contrast in her colon and a BM a couple of days ago, the patient appears to have an increase in her SB dilatation on films and clinically.  It is unclear if she has a psbo or still just an ileus.   -cont fulls for now as she really isn't taking in much to see how she tolerates this, although if she starts throwing up, she may require  and NGT. -patient would be a very poor candidate for surgical intervention if that were needed.  RN states that primary service is having palliative care evaluate the patient.  Given her poor nutritional status and the multitude of medical problems she has (CVA, presumed breast cancer, h/o CLL, a fib, CKD, etc) a palliative consult would be a good idea -will follow  FEN: fulls VTE: SCDs, eliquis ID: no current abx      LOS: 11 days    Henreitta Cea , [facility] 02/11/2018, 11:46 AM Pager: [phone]

## 2018-02-11 NOTE — Significant Event (Addendum)
Later today,patient continued to complain of pain.Rediscussed with her daughter [name] on phone  .She requests to make her as much comfortable as possible.We have decided to start full comfort care. Starting on morphine drip. Comfort care orders put in.

## 2018-02-11 NOTE — Care Management Note (Signed)
Case Management Note  Patient Details  Name: Kaitlyn Bauer MRN: [id number] Date of Birth: Dec 07, 1921  Subjective/Objective:                Due to patient's decline, residential hospice has been consulted through [facility]. No CM needs.     Action/Plan:   Expected Discharge Date:  02/02/18               Expected Discharge Plan:  [place]  In-House Referral:  Clinical Social Work  Discharge planning Services  CM Consult  Post Acute Care Choice:    Choice offered to:  Adult Children  DME Arranged:    DME Agency:     HH Arranged:    HH Agency:     Status of Service:  Completed, signed off  If discussed at [place] of Stay Meetings, dates discussed:    Additional Comments:  Carles Collet, RN 02/11/2018, 3:23 PM

## 2018-02-11 NOTE — Clinical Social Work Note (Signed)
CSW spoke with pt's daughter [name] reguarding [facility] choice. At this time [name] is requesting [place]. CSW will make referral.  Loletha Grayer, MSW [phone]

## 2018-02-12 DIAGNOSIS — I6300 Cerebral infarction due to thrombosis of unspecified precerebral artery: Secondary | ICD-10-CM

## 2018-02-19 NOTE — ED Provider Notes (Signed)
[facility] 3W PROGRESSIVE CARE Provider Note   CSN: [id number] Arrival date & time: 01/24/2018  2140     History   Chief Complaint Chief Complaint  Patient presents with  . Code Stroke    HPI Kaitlyn Bauer is a 84 y.o. female.  HPI   Patient initially accepted to Neurology stroke service for interventional thrombectomy, but NIH 2 on arrival. Dr. Lorraine Lax at bedside, discussed with family. Thrombectomy cancelled, plan to follow per Dr. [place]raine Lax. I reviewed vitals, labs, pt stable. Added on UA given ? Of recent UTI. Protecting airway.   Past Medical History:  Diagnosis Date  . Hypertension     Patient Active Problem List   Diagnosis Date Noted  . Atrial fibrillation ([place]) 02/07/2018  . Hyperlipidemia 02/07/2018  . Ileus ([place])   . Small bowel obstruction ([place])   . Abdominal pain   . Essential hypertension 02/05/2018  . Adynamic ileus ([place]) 02/05/2018  . Breast mass in female x 2, left 02/05/2018  . Leukocytosis, like d/t CLL 02/05/2018  . Left renal atrophy 02/05/2018  . Hyperglycemia 02/05/2018  . Chronic kidney disease, stage 3, mod decreased GFR ([facility]) 02/05/2018  . Thyroid nodule, right 02/05/2018  . Stroke (cerebrum) ([place]) 02/11/2018  . Fall 07/27/2017    Past Surgical History:  Procedure Laterality Date  . ABDOMINAL HYSTERECTOMY    . APPENDECTOMY    . CHOLECYSTECTOMY       OB History   None      Home Medications    Prior to Admission medications   Medication Sig Start Date End Date Taking? Authorizing Provider  amLODipine (NORVASC) 10 MG tablet Take 1 tablet (10 mg total) by mouth daily. 07/30/17  Yes Epifanio Lesches, MD  aspirin 325 MG EC tablet Take 325 mg by mouth as needed (arthritis).   Yes [provider]  Cholecalciferol (VITAMIN D-1000 MAX ST) 1000 units tablet Take 1,000 Units by mouth daily.   Yes [provider]  doxycycline (VIBRAMYCIN) 100 MG capsule Take 1 capsule (100 mg total) by mouth 2 (two) times daily. 07/30/17   Yes Epifanio Lesches, MD  hydrALAZINE (APRESOLINE) 25 MG tablet Take 1 tablet (25 mg total) by mouth 3 (three) times daily. 07/30/17 07/30/18 Yes Epifanio Lesches, MD  Multiple Vitamin (MULTI-VITAMINS) TABS Take 1 tablet by mouth daily.   Yes [provider]    Family History Family History  Family history unknown: Yes    Social History Social History   Tobacco Use  . Smoking status: Former Research scientist (life sciences)  . Smokeless tobacco: Never Used  Substance Use Topics  . Alcohol use: No    Frequency: Never  . Drug use: No     Allergies   Penicillins   Review of Systems Review of Systems  Unable to perform ROS: Mental status change     Physical Exam Updated Vital Signs BP (!) 90/42 (BP Location: Left Arm)   Pulse (!) 105   Temp 98.1 F (36.7 C) (Oral)   Resp (!) 0   Ht 5\' 1"  (1.549 m)   Wt 65.9 kg (145 lb 4.8 oz)   SpO2 (!) 66%   BMI 27.45 kg/m   Physical Exam  Constitutional: She is oriented to person, place, and time. She appears well-developed and well-nourished. No distress.  HENT:  Head: Normocephalic and atraumatic.  Eyes: Conjunctivae are normal.  Neck: Neck supple.  Cardiovascular: Normal rate, regular rhythm and normal heart sounds.  Pulmonary/Chest: Effort normal. No respiratory distress. She has no wheezes.  Abdominal: She exhibits no distension.  Musculoskeletal: She exhibits no edema.  Neurological: She is alert and oriented to person, place, and time. She exhibits normal muscle tone.  Skin: Skin is warm. Capillary refill takes less than 2 seconds. No rash noted.  Nursing note and vitals reviewed.    ED Treatments / Results  Labs (all labs ordered are listed, but only abnormal results are displayed) Labs Reviewed  HEMOGLOBIN A1C - Abnormal; Notable for the following components:      Result Value   Hgb A1c MFr Bld 5.9 (*)    All other components within normal limits  CBC - Abnormal; Notable for the following components:   WBC 15.6 (*)     All other components within normal limits  CREATININE, SERUM - Abnormal; Notable for the following components:   GFR calc non Af Amer 55 (*)    All other components within normal limits  BASIC METABOLIC PANEL - Abnormal; Notable for the following components:   Glucose, Bld 111 (*)    BUN 5 (*)    All other components within normal limits  OCCULT BLOOD GASTRIC / DUODENUM (SPECIMEN CUP) - Abnormal; Notable for the following components:   Occult Blood, Gastric POSITIVE (*)    All other components within normal limits  CBC - Abnormal; Notable for the following components:   WBC 23.5 (*)    All other components within normal limits  BASIC METABOLIC PANEL - Abnormal; Notable for the following components:   Glucose, Bld 188 (*)    Creatinine, Ser 1.08 (*)    GFR calc non Af Amer 43 (*)    GFR calc Af Amer 50 (*)    All other components within normal limits  BASIC METABOLIC PANEL - Abnormal; Notable for the following components:   Glucose, Bld 140 (*)    Creatinine, Ser 1.19 (*)    GFR calc non Af Amer 38 (*)    GFR calc Af Amer 44 (*)    All other components within normal limits  CBC - Abnormal; Notable for the following components:   WBC 28.3 (*)    All other components within normal limits  URINALYSIS, ROUTINE W REFLEX MICROSCOPIC - Abnormal; Notable for the following components:   APPearance HAZY (*)    Bilirubin Urine SMALL (*)    All other components within normal limits  URINALYSIS, COMPLETE (UACMP) WITH MICROSCOPIC - Abnormal; Notable for the following components:   Color, Urine AMBER (*)    APPearance CLOUDY (*)    Ketones, ur 5 (*)    Leukocytes, UA MODERATE (*)    Bacteria, UA RARE (*)    Crystals PRESENT (*)    All other components within normal limits  CBC - Abnormal; Notable for the following components:   WBC 19.0 (*)    All other components within normal limits  BASIC METABOLIC PANEL - Abnormal; Notable for the following components:   Glucose, Bld 121 (*)     Creatinine, Ser 1.08 (*)    GFR calc non Af Amer 43 (*)    GFR calc Af Amer 50 (*)    All other components within normal limits  VITAMIN B12 - Abnormal; Notable for the following components:   Vitamin B-12 1,209 (*)    All other components within normal limits  GLUCOSE, CAPILLARY - Abnormal; Notable for the following components:   Glucose-Capillary 165 (*)    All other components within normal limits  GLUCOSE, CAPILLARY - Abnormal; Notable for the following components:  Glucose-Capillary 118 (*)    All other components within normal limits  CBC - Abnormal; Notable for the following components:   WBC 26.3 (*)    All other components within normal limits  BASIC METABOLIC PANEL - Abnormal; Notable for the following components:   Glucose, Bld 147 (*)    BUN 24 (*)    Creatinine, Ser 1.08 (*)    GFR calc non Af Amer 43 (*)    GFR calc Af Amer 50 (*)    All other components within normal limits  GLUCOSE, CAPILLARY - Abnormal; Notable for the following components:   Glucose-Capillary 157 (*)    All other components within normal limits  CBC WITH DIFFERENTIAL/PLATELET - Abnormal; Notable for the following components:   WBC 28.2 (*)    Neutro Abs 11.0 (*)    Lymphs Abs 16.1 (*)    Monocytes Absolute 1.1 (*)    All other components within normal limits  GLUCOSE, CAPILLARY - Abnormal; Notable for the following components:   Glucose-Capillary 137 (*)    All other components within normal limits  GLUCOSE, CAPILLARY - Abnormal; Notable for the following components:   Glucose-Capillary 149 (*)    All other components within normal limits  BASIC METABOLIC PANEL - Abnormal; Notable for the following components:   Glucose, Bld 132 (*)    BUN 25 (*)    Creatinine, Ser 1.20 (*)    Calcium 8.3 (*)    GFR calc non Af Amer 38 (*)    GFR calc Af Amer 44 (*)    All other components within normal limits  CBC WITH DIFFERENTIAL/PLATELET - Abnormal; Notable for the following components:   WBC 31.4  (*)    Neutro Abs 11.9 (*)    Lymphs Abs 17.0 (*)    Monocytes Absolute 2.5 (*)    All other components within normal limits  GLUCOSE, CAPILLARY - Abnormal; Notable for the following components:   Glucose-Capillary 157 (*)    All other components within normal limits  GLUCOSE, CAPILLARY - Abnormal; Notable for the following components:   Glucose-Capillary 140 (*)    All other components within normal limits  GLUCOSE, CAPILLARY - Abnormal; Notable for the following components:   Glucose-Capillary 135 (*)    All other components within normal limits  BASIC METABOLIC PANEL - Abnormal; Notable for the following components:   Glucose, Bld 117 (*)    BUN 26 (*)    Creatinine, Ser 1.20 (*)    Calcium 8.2 (*)    GFR calc non Af Amer 38 (*)    GFR calc Af Amer 44 (*)    All other components within normal limits  CBC WITH DIFFERENTIAL/PLATELET - Abnormal; Notable for the following components:   WBC 20.6 (*)    RBC 3.69 (*)    Hemoglobin 11.5 (*)    HCT 35.4 (*)    Lymphs Abs 12.0 (*)    Monocytes Absolute 1.2 (*)    All other components within normal limits  GLUCOSE, CAPILLARY - Abnormal; Notable for the following components:   Glucose-Capillary 110 (*)    All other components within normal limits  BASIC METABOLIC PANEL - Abnormal; Notable for the following components:   Glucose, Bld 103 (*)    Calcium 8.3 (*)    GFR calc non Af Amer 47 (*)    GFR calc Af Amer 55 (*)    All other components within normal limits  CBC WITH DIFFERENTIAL/PLATELET - Abnormal; Notable for the following  components:   WBC 16.4 (*)    RBC 3.58 (*)    Hemoglobin 11.2 (*)    HCT 34.2 (*)    Lymphs Abs 8.7 (*)    Monocytes Absolute 1.1 (*)    All other components within normal limits  GLUCOSE, CAPILLARY - Abnormal; Notable for the following components:   Glucose-Capillary 113 (*)    All other components within normal limits  GLUCOSE, CAPILLARY - Abnormal; Notable for the following components:    Glucose-Capillary 130 (*)    All other components within normal limits  CBC WITH DIFFERENTIAL/PLATELET - Abnormal; Notable for the following components:   WBC 32.8 (*)    Neutro Abs 11.5 (*)    Lymphs Abs 19.7 (*)    Basophils Absolute 0.3 (*)    All other components within normal limits  GLUCOSE, CAPILLARY - Abnormal; Notable for the following components:   Glucose-Capillary 108 (*)    All other components within normal limits  GLUCOSE, CAPILLARY - Abnormal; Notable for the following components:   Glucose-Capillary 123 (*)    All other components within normal limits  GLUCOSE, CAPILLARY - Abnormal; Notable for the following components:   Glucose-Capillary 120 (*)    All other components within normal limits  GLUCOSE, CAPILLARY - Abnormal; Notable for the following components:   Glucose-Capillary 132 (*)    All other components within normal limits  GLUCOSE, CAPILLARY - Abnormal; Notable for the following components:   Glucose-Capillary 120 (*)    All other components within normal limits  GLUCOSE, CAPILLARY - Abnormal; Notable for the following components:   Glucose-Capillary 115 (*)    All other components within normal limits  BASIC METABOLIC PANEL - Abnormal; Notable for the following components:   Glucose, Bld 112 (*)    BUN 25 (*)    Creatinine, Ser 1.05 (*)    Calcium 8.2 (*)    GFR calc non Af Amer 44 (*)    GFR calc Af Amer 51 (*)    All other components within normal limits  CBC WITH DIFFERENTIAL/PLATELET - Abnormal; Notable for the following components:   WBC 23.2 (*)    RBC 3.74 (*)    Hemoglobin 11.5 (*)    Lymphs Abs 14.3 (*)    Monocytes Absolute 1.2 (*)    All other components within normal limits  APTT - Abnormal; Notable for the following components:   aPTT 48 (*)    All other components within normal limits  GLUCOSE, CAPILLARY - Abnormal; Notable for the following components:   Glucose-Capillary 119 (*)    All other components within normal limits    HEPARIN LEVEL (UNFRACTIONATED) - Abnormal; Notable for the following components:   Heparin Unfractionated 1.34 (*)    All other components within normal limits  GLUCOSE, CAPILLARY - Abnormal; Notable for the following components:   Glucose-Capillary 106 (*)    All other components within normal limits  APTT - Abnormal; Notable for the following components:   aPTT 87 (*)    All other components within normal limits  APTT - Abnormal; Notable for the following components:   aPTT 186 (*)    All other components within normal limits  CBC - Abnormal; Notable for the following components:   WBC 33.4 (*)    RBC 2.77 (*)    Hemoglobin 8.6 (*)    HCT 27.6 (*)    All other components within normal limits  BASIC METABOLIC PANEL - Abnormal; Notable for the following components:  CO2 20 (*)    Glucose, Bld 156 (*)    Calcium 7.8 (*)    GFR calc non Af Amer 54 (*)    All other components within normal limits  APTT - Abnormal; Notable for the following components:   aPTT 181 (*)    All other components within normal limits  GLUCOSE, CAPILLARY - Abnormal; Notable for the following components:   Glucose-Capillary 104 (*)    All other components within normal limits  APTT - Abnormal; Notable for the following components:   aPTT 124 (*)    All other components within normal limits  HEPARIN LEVEL (UNFRACTIONATED) - Abnormal; Notable for the following components:   Heparin Unfractionated 0.86 (*)    All other components within normal limits  GLUCOSE, CAPILLARY - Abnormal; Notable for the following components:   Glucose-Capillary 105 (*)    All other components within normal limits  GLUCOSE, CAPILLARY - Abnormal; Notable for the following components:   Glucose-Capillary 143 (*)    All other components within normal limits  GLUCOSE, CAPILLARY - Abnormal; Notable for the following components:   Glucose-Capillary 143 (*)    All other components within normal limits  GLUCOSE, CAPILLARY -  Abnormal; Notable for the following components:   Glucose-Capillary 129 (*)    All other components within normal limits  GLUCOSE, CAPILLARY - Abnormal; Notable for the following components:   Glucose-Capillary 111 (*)    All other components within normal limits  CBC - Abnormal; Notable for the following components:   WBC 34.6 (*)    RBC 2.27 (*)    Hemoglobin 7.1 (*)    HCT 22.6 (*)    RDW 15.8 (*)    All other components within normal limits  GLUCOSE, CAPILLARY - Abnormal; Notable for the following components:   Glucose-Capillary 140 (*)    All other components within normal limits  GLUCOSE, CAPILLARY - Abnormal; Notable for the following components:   Glucose-Capillary 137 (*)    All other components within normal limits  GLUCOSE, CAPILLARY - Abnormal; Notable for the following components:   Glucose-Capillary 117 (*)    All other components within normal limits  GLUCOSE, CAPILLARY - Abnormal; Notable for the following components:   Glucose-Capillary 125 (*)    All other components within normal limits  MRSA PCR SCREENING  URINE CULTURE  CULTURE, BLOOD (ROUTINE X 2)  CULTURE, BLOOD (ROUTINE X 2)  LIPID PANEL  AMMONIA  TSH  PATHOLOGIST SMEAR REVIEW  GLUCOSE, CAPILLARY  GLUCOSE, CAPILLARY  GLUCOSE, CAPILLARY  GLUCOSE, CAPILLARY  GLUCOSE, CAPILLARY  GLUCOSE, CAPILLARY    EKG None  Radiology No results found.  Procedures Procedures (including critical care time)  Medications Ordered in ED Medications  sulfamethoxazole-trimethoprim (BACTRIM DS,SEPTRA DS) 800-160 MG per tablet 1 tablet (1 tablet Oral Given 02/06/18 1233)   stroke: mapping our early stages of recovery book (1 each Does not apply Given 02/01/18 0056)  potassium chloride 10 mEq in 100 mL IVPB ( Intravenous Rate/Dose Verify 02/01/18 0700)  ondansetron (ZOFRAN) injection 4 mg (4 mg Intravenous Given 02/01/18 1553)  ondansetron (ZOFRAN) 4 MG/2ML injection (  Duplicate 11/01/79 1914)  iohexol (OMNIPAQUE) 300  MG/ML solution 80 mL (100 mLs Intravenous Contrast Given 02/04/18 1857)  sodium phosphate (FLEET) 7-19 GM/118ML enema 1 enema (1 enema Rectal Given 02/04/18 2355)  diatrizoate meglumine-sodium (GASTROGRAFIN) 66-10 % solution 90 mL (90 mLs Per NG tube Given 02/08/18 1634)  lidocaine (XYLOCAINE) 2 % viscous mouth solution 15 mL (15 mLs Mouth/Throat  Given 02/08/18 1552)  lidocaine (XYLOCAINE) 2 % viscous mouth solution (  Given 02/08/18 1552)  traMADol (ULTRAM) tablet 50 mg (50 mg Oral Given 02/10/18 0436)     Initial Impression / Assessment and Plan / ED Course  I have reviewed the triage vital signs and the nursing notes.  Pertinent labs & imaging results that were available during my care of the patient were reviewed by me and considered in my medical decision making (see chart for details).     Admit to Neuro ICU. No intervention indicated at this time per Neuro.  Final Clinical Impressions(s) / ED Diagnoses     Duffy Bruce, MD 02/19/18 [phone]

## 2018-02-22 NOTE — Progress Notes (Signed)
PT Cancellation Note  Patient Details Name: Kaitlyn Bauer MRN: [id number] DOB: 1921/07/29   Cancelled Treatment:    Reason Eval/Treat Not Completed: Other (comment). Pt started on morphine drip and has been transitioned to full comfort care. Skilled PT no longer needed. PT signing off.  [name], PT, DPT Pager #: [phone] Office #: [phone]    [place] 2018-03-13, 7:30 AM

## 2018-02-22 NOTE — Progress Notes (Signed)
CSW alerted by RN and MD that patient has passed. CSW contacted [facility] to inform them that placement is no longer needed.  CSW signing off.  Laveda Abbe, [facility] Clinical Social Worker [phone]

## 2018-02-22 NOTE — Progress Notes (Signed)
Pt passed away at 0743 with family at bedside. MD made aware. CDS made aware also. Pt does not qualify for donor ship per age.

## 2018-02-22 NOTE — Death Summary Note (Addendum)
Death Summary  Kaitlyn Bauer GMW:[id number] DOB: 11-26-1921 DOA: 02-20-2018  PCP: Kaitlyn Hire, MD  Admit date: Feb 20, 2018 Date of Death: 03/04/2018 Time of Death: [phone]  History of present illness:  Patient is a 84 year old female with past medical history of hypertension was admitted for multifocal acute left MCA territory nonhemorrhagic infarcts.  She developed nausea and vomiting during the hospitalization.  CT imaging was concerning for small bowel obstruction.  Hospitalist service was consulted for ileus and finally took over the case. General surgery consulted  for possible worsening of small bowel obstruction.  NG tube  taken out . Diet was advanced but she continued to remain weak, lethargic and has very poor appetite.  She was rapidly going downhill .She also has advanced left-sided breast cancer,CLL. After extensive discussion with her daughter Vaughan Bauer, she has expressed her desire to make her comfortable and pursue for residential hospice placement.   Patient complained of significant pain. Her daughter requested to make her as  comfortable as possible.We have decided to start full comfort care. Started on morphine drip. Comfort care orders put in.She expired at 0743 this morning.  Following problems were addressed during hospitalization:  Ileus: Surgery was  following.    Acute ischemic stroke: Multiple left MCA infarcts, likely embolic secondary to known atrial fibrillation not on anticoagulation .Neurology was following.  On Eliquis and statin.  Afib: CHA2DS2-VASc of 4.  On Eliquis.  Left breast mass: Likely advanced breast cancer.There was  plan for follow up with oncology Dr. Rogue Bussing in [place].    Leukocytosis:Most likely chronic.History of CLL which could be contributing. Cultures are negative.  Chest x-ray did not show any infiltrate.  Left renal mass/atrophy: Outpatient evaluation by urology was planned.Now hospice candidate.  Hyperglycemia: Hemoglobin A1c  was a 5.9.  CKD stage 3: Stable  Right thyroid nodule: TSH 2.6.   Deconditioning/debility /stroke: Patient evaluated by  PT and OT and recommended home health physical therapy.       Final Diagnoses:  1.   Acute ischemic stroke, Ileus, CLL, left breast mass   The results of significant diagnostics from this hospitalization (including imaging, microbiology, ancillary and laboratory) are listed below for reference.    Significant Diagnostic Studies: Ct Angio Head W Or Wo Contrast  Result Date: Feb 20, 2018 CLINICAL DATA:  84 y/o F; slurred speech, extremity weakness, and confusion, symptoms resolved. EXAM: CT ANGIOGRAPHY HEAD AND NECK CT PERFUSION BRAIN TECHNIQUE: Multidetector CT imaging of the head and neck was performed using the standard protocol during bolus administration of intravenous contrast. Multiplanar CT image reconstructions and MIPs were obtained to evaluate the vascular anatomy. Carotid stenosis measurements (when applicable) are obtained utilizing NASCET criteria, using the distal internal carotid diameter as the denominator. Multiphase CT imaging of the brain was performed following IV bolus contrast injection. Subsequent parametric perfusion maps were calculated using RAPID software. CONTRAST:  126mL ISOVUE-370 IOPAMIDOL (ISOVUE-370) INJECTION 76% COMPARISON:  Feb 20, 2018 CT head.  07/28/2017 carotid duplex. FINDINGS: CTA NECK FINDINGS Aortic arch: Standard branching. Imaged portion shows no evidence of aneurysm or dissection. No significant stenosis of the major arch vessel origins. Right carotid system: No evidence of dissection, stenosis (50% or greater) or occlusion. Left carotid system: No evidence of dissection, stenosis (50% or greater) or occlusion. Vertebral arteries: Codominant. No evidence of dissection, stenosis (50% or greater) or occlusion. Skeleton: Mild cervical spondylosis. C6-7 grade 1 anterolisthesis with prominent facet arthropathy. No high-grade bony canal  stenosis. Other neck: 15 mm nodule in the right lobe of  thyroid (series 6, image 74). Upper chest: Negative. Review of the MIP images confirms the above findings CTA HEAD FINDINGS Anterior circulation: 6 mm anteriorly directed aneurysm arising from the distal horizontal petrous right ICA (series 7, image 211 and series 9, image 59). Left MCA M2 inferior division short segment distal occlusion/near occlusion at the level of the dorsal insula (series 12, image 23), with patent downstream vessel. No additional large vessel occlusion, aneurysm, or high-grade stenosis. Posterior circulation: Mild left P1 stenosis. No significant proximal stenosis, proximal occlusion, aneurysm, or vascular malformation. Segments of stenosis in the distal PCA circulation bilaterally. Venous sinuses: As permitted by contrast timing, patent. Anatomic variants: Anterior communicating artery and left posterior communicating arteries are present. No right posterior communicating artery identified, likely hypoplastic or absent. Delayed phase: No abnormal intracranial enhancement. Review of the MIP images confirms the above findings CT Brain Perfusion Findings: CBF (<30%) Volume: 54mL Perfusion (Tmax>6.0s) volume: 13 ccmL Mismatch Volume: 13 ccmL Infarction Location:Left parietal lobe oligemia. No infarct by standard perfusion criteria. IMPRESSION: 1. Left parietal lobe oligemia with ischemic penumbra of 13 cc. No acute infarct by standard perfusion criteria. 2. Left M2 inferior division distal short segment of occlusion/near occlusion with patent downstream vessel. 3. 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 4. 15 mm nodule in the right lobe of thyroid gland, thyroid ultrasound is recommended on a nonemergent basis. These results were called by telephone at the time of interpretation on 01/22/2018 at 7:33 pm to Dr. Larae Grooms , who verbally acknowledged these results. Electronically Signed   By:  Kristine Garbe M.D.   On: 02/20/2018 19:39   Dg Chest 2 View  Result Date: 02/05/2018 CLINICAL DATA:  Dyspnea. EXAM: CHEST - 2 VIEW COMPARISON:  05/21/2008 FINDINGS: Mild cardiomegaly. Moderate aortic atherosclerosis with uncoiling is noted. No acute pulmonary consolidation or CHF. No effusion or pneumothorax. Osteoarthritis of the [facility] and glenohumeral joints bilaterally. Degenerative change noted dorsal spine without suspicious osseous abnormality. IMPRESSION: Stable cardiomegaly with aortic atherosclerosis. No active pulmonary disease. Electronically Signed   By: Ashley Royalty M.D.   On: 02/10/2018 23:38   Dg Abd 1 View  Result Date: 02/08/2018 CLINICAL DATA:  NG tube placement EXAM: ABDOMEN - 1 VIEW COMPARISON:  None. FINDINGS: NG tube tip is in the mid to distal stomach. IMPRESSION: NG tube tip in the mid to distal stomach. Electronically Signed   By: Rolm Baptise M.D.   On: 02/08/2018 14:01   Ct Head Wo Contrast  Result Date: 02/02/2018 CLINICAL DATA:  Follow-up of stroke. EXAM: CT HEAD WITHOUT CONTRAST TECHNIQUE: Contiguous axial images were obtained from the base of the skull through the vertex without intravenous contrast. COMPARISON:  02/07/2018 CT head, CTA head, CT perfusion head, and MRI head. FINDINGS: Brain: Small region of cortical hypoattenuation compatible with infarction within the left parietal lobe stable in distribution in comparison with the prior MRI of the head. No new stroke, hemorrhage, or focal mass effect of the brain identified. No extra-axial collection, hydrocephalus, or herniation. Stable nonspecific parenchymal calcification in right periatrial white matter. Stable chronic microvascular ischemic changes and parenchymal volume loss of the brain. Vascular: Calcific atherosclerosis of the carotid siphons. No hyperdense vessel identified. Skull: Normal. Negative for fracture or focal lesion. Sinuses/Orbits: Mild right maxillary sinus mucosal thickening. Normal aeration  of mastoid air cells. Bilateral intra-ocular lens replacement. Other: None. IMPRESSION: 1. Small left parietal lobe cortical infarctions stable in distribution in comparison with prior MRI of  the head. 2. No new acute intracranial abnormality. 3. Stable background of chronic microvascular ischemic changes and parenchymal volume loss of the brain. Electronically Signed   By: Kristine Garbe M.D.   On: 02/02/2018 15:30   Ct Angio Neck W Or Wo Contrast  Result Date: 02/09/2018 CLINICAL DATA:  84 y/o F; slurred speech, extremity weakness, and confusion, symptoms resolved. EXAM: CT ANGIOGRAPHY HEAD AND NECK CT PERFUSION BRAIN TECHNIQUE: Multidetector CT imaging of the head and neck was performed using the standard protocol during bolus administration of intravenous contrast. Multiplanar CT image reconstructions and MIPs were obtained to evaluate the vascular anatomy. Carotid stenosis measurements (when applicable) are obtained utilizing NASCET criteria, using the distal internal carotid diameter as the denominator. Multiphase CT imaging of the brain was performed following IV bolus contrast injection. Subsequent parametric perfusion maps were calculated using RAPID software. CONTRAST:  121mL ISOVUE-370 IOPAMIDOL (ISOVUE-370) INJECTION 76% COMPARISON:  02/07/2018 CT head.  07/28/2017 carotid duplex. FINDINGS: CTA NECK FINDINGS Aortic arch: Standard branching. Imaged portion shows no evidence of aneurysm or dissection. No significant stenosis of the major arch vessel origins. Right carotid system: No evidence of dissection, stenosis (50% or greater) or occlusion. Left carotid system: No evidence of dissection, stenosis (50% or greater) or occlusion. Vertebral arteries: Codominant. No evidence of dissection, stenosis (50% or greater) or occlusion. Skeleton: Mild cervical spondylosis. C6-7 grade 1 anterolisthesis with prominent facet arthropathy. No high-grade bony canal stenosis. Other neck: 15 mm nodule in the  right lobe of thyroid (series 6, image 74). Upper chest: Negative. Review of the MIP images confirms the above findings CTA HEAD FINDINGS Anterior circulation: 6 mm anteriorly directed aneurysm arising from the distal horizontal petrous right ICA (series 7, image 211 and series 9, image 59). Left MCA M2 inferior division short segment distal occlusion/near occlusion at the level of the dorsal insula (series 12, image 23), with patent downstream vessel. No additional large vessel occlusion, aneurysm, or high-grade stenosis. Posterior circulation: Mild left P1 stenosis. No significant proximal stenosis, proximal occlusion, aneurysm, or vascular malformation. Segments of stenosis in the distal PCA circulation bilaterally. Venous sinuses: As permitted by contrast timing, patent. Anatomic variants: Anterior communicating artery and left posterior communicating arteries are present. No right posterior communicating artery identified, likely hypoplastic or absent. Delayed phase: No abnormal intracranial enhancement. Review of the MIP images confirms the above findings CT Brain Perfusion Findings: CBF (<30%) Volume: 59mL Perfusion (Tmax>6.0s) volume: 13 ccmL Mismatch Volume: 13 ccmL Infarction Location:Left parietal lobe oligemia. No infarct by standard perfusion criteria. IMPRESSION: 1. Left parietal lobe oligemia with ischemic penumbra of 13 cc. No acute infarct by standard perfusion criteria. 2. Left M2 inferior division distal short segment of occlusion/near occlusion with patent downstream vessel. 3. 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 4. 15 mm nodule in the right lobe of thyroid gland, thyroid ultrasound is recommended on a nonemergent basis. These results were called by telephone at the time of interpretation on 01/24/2018 at 7:33 pm to Dr. Larae Grooms , who verbally acknowledged these results. Electronically Signed   By: Kristine Garbe M.D.   On:  02/04/2018 19:39   Mr Brain Wo Contrast  Result Date: 02/01/2018 CLINICAL DATA:  Acute onset of aphasia and facial droop at 2:30 p.m. Follow-up LEFT M2 occlusion. History of hypertension. EXAM: MRI HEAD WITHOUT CONTRAST TECHNIQUE: Multiplanar, multiecho pulse sequences of the brain and surrounding structures were obtained without intravenous contrast. COMPARISON:  CT HEAD January 31, 2018  FINDINGS: INTRACRANIAL CONTENTS: Patchy reduced diffusion LEFT posterior insula and LEFT parietal cortex with low ADC values. Numerous scattered supra infratentorial chronic microhemorrhages. Ventricles and sulci are normal for patient's age. No midline shift, mass effect or masses. Old small RIGHT cerebellar infarct. Numerous supratentorial subcentimeter white matter flared T2 hyperintensities with cystic component. No abnormal extra-axial fluid collections. VASCULAR: Normal major intracranial vascular flow voids present at skull base. SKULL AND UPPER CERVICAL SPINE: No abnormal sellar expansion. Nonspecific low signal calvarium. Craniocervical junction maintained. SINUSES/ORBITS: The mastoid air-cells and included paranasal sinuses are well-aerated.The included ocular globes and orbital contents are non-suspicious. Status post bilateral ocular lens implants. OTHER: None. IMPRESSION: 1. Multifocal acute small LEFT MCA territory nonhemorrhagic infarcts. 2. Numerous chronic microhemorrhages suggesting amyloid angiopathy. 3. Mild-to-moderate chronic small vessel ischemic changes. Electronically Signed   By: Elon Alas M.D.   On: 02/01/2018 00:27   Ct Abdomen Pelvis W Contrast  Result Date: 02/04/2018 CLINICAL DATA:  84 y/o F; nausea, vomiting, and abdominal pain with leukocytosis. EXAM: CT ABDOMEN AND PELVIS WITH CONTRAST TECHNIQUE: Multidetector CT imaging of the abdomen and pelvis was performed using the standard protocol following bolus administration of intravenous contrast. CONTRAST:  152mL OMNIPAQUE IOHEXOL 300  MG/ML  SOLN COMPARISON:  None. FINDINGS: Lower chest: Small bilateral pleural effusions. Small hiatal hernia and mild fluid-filled esophageal distention. Two left breast masses measuring 4.0 cm and 2.8 cm (series 3, image 8 and 10). 8 mm nodule in the dependent right lower lobe (series 4, image 10). Hepatobiliary: No focal liver abnormality is seen. No gallstones, gallbladder wall thickening, or biliary dilatation. Pancreas: Unremarkable. No pancreatic ductal dilatation or surrounding inflammatory changes. Spleen: Normal in size without focal abnormality. Adrenals/Urinary Tract: Atrophic left kidney. Masslike distention of the left renal pelvis measuring up to 2.2 cm. Widening of the left proximal ureter. Multiple small right kidney peripelvic cysts. No additional right kidney focal lesion identified. Normal adrenal glands. Normal right ureter. Normal bladder. Stomach/Bowel: Diffuse distention of the stomach and small bowel tapering to a collapsed terminal ileum. Extensive sigmoid diverticulosis, no findings of acute diverticulitis. Appendix not identified. Vascular/Lymphatic: Aortic atherosclerosis. No enlarged abdominal or pelvic lymph nodes. Reproductive: Status post hysterectomy. No adnexal masses. Other: Small volume of ascites and mild mesenteric edema. Musculoskeletal: No fracture is seen. Multilevel degenerative changes of the spine. IMPRESSION: 1. Diffuse distention of small bowel tapering to a collapsed terminal ileum. Findings suggest enteritis or partial obstruction. 2. Small volume of ascites and mesenteric edema, possibly reactive inflammation or third-spacing. 3. Two left breast masses measuring up to 4.0 cm. Breast malignancy is suspected and clinical correlation is recommended. 4. Atrophic left kidney. Masslike distension of the left renal pelvis with widened proximal ureter. Findings may represent urothelial carcinoma or pyelitis. 5. 8 mm right lower lobe pulmonary nodule. 6. Small bilateral  pleural effusions. 7. Small hiatal hernia and dilated fluid-filled lower esophagus. 8. Sigmoid diverticulosis without findings of acute diverticulitis. Electronically Signed   By: Kristine Garbe M.D.   On: 02/04/2018 19:34   Ct Cerebral Perfusion W Contrast  Result Date: 01/27/2018 CLINICAL DATA:  84 y/o F; slurred speech, extremity weakness, and confusion, symptoms resolved. EXAM: CT ANGIOGRAPHY HEAD AND NECK CT PERFUSION BRAIN TECHNIQUE: Multidetector CT imaging of the head and neck was performed using the standard protocol during bolus administration of intravenous contrast. Multiplanar CT image reconstructions and MIPs were obtained to evaluate the vascular anatomy. Carotid stenosis measurements (when applicable) are obtained utilizing NASCET criteria, using the distal internal carotid diameter  as the denominator. Multiphase CT imaging of the brain was performed following IV bolus contrast injection. Subsequent parametric perfusion maps were calculated using RAPID software. CONTRAST:  118mL ISOVUE-370 IOPAMIDOL (ISOVUE-370) INJECTION 76% COMPARISON:  01/24/2018 CT head.  07/28/2017 carotid duplex. FINDINGS: CTA NECK FINDINGS Aortic arch: Standard branching. Imaged portion shows no evidence of aneurysm or dissection. No significant stenosis of the major arch vessel origins. Right carotid system: No evidence of dissection, stenosis (50% or greater) or occlusion. Left carotid system: No evidence of dissection, stenosis (50% or greater) or occlusion. Vertebral arteries: Codominant. No evidence of dissection, stenosis (50% or greater) or occlusion. Skeleton: Mild cervical spondylosis. C6-7 grade 1 anterolisthesis with prominent facet arthropathy. No high-grade bony canal stenosis. Other neck: 15 mm nodule in the right lobe of thyroid (series 6, image 74). Upper chest: Negative. Review of the MIP images confirms the above findings CTA HEAD FINDINGS Anterior circulation: 6 mm anteriorly directed aneurysm  arising from the distal horizontal petrous right ICA (series 7, image 211 and series 9, image 59). Left MCA M2 inferior division short segment distal occlusion/near occlusion at the level of the dorsal insula (series 12, image 23), with patent downstream vessel. No additional large vessel occlusion, aneurysm, or high-grade stenosis. Posterior circulation: Mild left P1 stenosis. No significant proximal stenosis, proximal occlusion, aneurysm, or vascular malformation. Segments of stenosis in the distal PCA circulation bilaterally. Venous sinuses: As permitted by contrast timing, patent. Anatomic variants: Anterior communicating artery and left posterior communicating arteries are present. No right posterior communicating artery identified, likely hypoplastic or absent. Delayed phase: No abnormal intracranial enhancement. Review of the MIP images confirms the above findings CT Brain Perfusion Findings: CBF (<30%) Volume: 42mL Perfusion (Tmax>6.0s) volume: 13 ccmL Mismatch Volume: 13 ccmL Infarction Location:Left parietal lobe oligemia. No infarct by standard perfusion criteria. IMPRESSION: 1. Left parietal lobe oligemia with ischemic penumbra of 13 cc. No acute infarct by standard perfusion criteria. 2. Left M2 inferior division distal short segment of occlusion/near occlusion with patent downstream vessel. 3. 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 4. 15 mm nodule in the right lobe of thyroid gland, thyroid ultrasound is recommended on a nonemergent basis. These results were called by telephone at the time of interpretation on 02/03/2018 at 7:33 pm to Dr. Larae Grooms , who verbally acknowledged these results. Electronically Signed   By: Kristine Garbe M.D.   On: 02/10/2018 19:39   Dg Chest Port 1 View  Result Date: 02/03/2018 CLINICAL DATA:  Leukocytosis. EXAM: PORTABLE CHEST 1 VIEW COMPARISON:  Chest x-ray dated January 31, 2018. FINDINGS: Stable mild  cardiomegaly. Atherosclerotic calcification of the aortic arch. Normal pulmonary vascularity. No focal consolidation, pleural effusion, or pneumothorax. No acute osseous abnormality. IMPRESSION: No active disease. Electronically Signed   By: Titus Dubin M.D.   On: 02/03/2018 08:51   Dg Abd 2 Views  Result Date: 02/06/2018 CLINICAL DATA:  Ileus. EXAM: ABDOMEN - 2 VIEW COMPARISON:  Radiograph dated 02/04/2018 and CT scan dated 02/04/2018 FINDINGS: Interval decreased small bowel distention since the prior study. There remain a couple of slightly prominent small bowel loops in the mid abdomen. Colon is not distended. No visible free air. No acute bone abnormality. IMPRESSION: Improving small bowel distention consistent with improving ileus or resolving small bowel obstruction. Electronically Signed   By: Lorriane Shire M.D.   On: 02/06/2018 10:31   Dg Abd Acute W/chest  Result Date: 02/11/2018 CLINICAL DATA:  Small bowel obstruction. Best pictures  obtainable due to pt condition. EXAM: DG ABDOMEN ACUTE W/ 1V CHEST COMPARISON:  Abdominal film 02/09/2018 FINDINGS: No evidence intraperitoneal free air beneath the diaphragms semi erect chest film. There is LEFT basilar atelectasis. Oral contrast is present within the ascending, descending colon, and rectum. Persistent dilated loops of small bowel to 3-3.5 cm. This is slightly more distended than 17/19/19. No air-fluid levels. IMPRESSION: 1. Oral contrast has progressed to the rectum. 2. Dilated of small bowel without air-fluid levels suggest ileus or partial small bowel obstruction. Bowel dilatation is not improved from 02/09/2018. 3. No intraperitoneal free air evident on semi upright exam Electronically Signed   By: Suzy Bouchard M.D.   On: 02/11/2018 11:23   Dg Abd Portable 1v-small Bowel Obstruction Protocol-initial, 8 Hr Delay  Result Date: 02/09/2018 CLINICAL DATA:  Small-bowel obstruction, follow-up 8 hour imaging EXAM: PORTABLE ABDOMEN - 1 VIEW  COMPARISON:  02/07/2018 FINDINGS: Contrast is noted in the expected location of the gastric fundus proximal body. Mild contrast filled distention of small bowel loops is identified however there is contrast that has passed into the colon consistent with a partial SBO. No free air is identified. Cholecystectomy clips are present. IMPRESSION: Enteric contrast is noted in the descending colon. Contrast distended small bowel loops persist. Findings would suggest a partial SBO. Electronically Signed   By: Ashley Royalty M.D.   On: 02/09/2018 03:51   Dg Abd Portable 1v  Result Date: 02/07/2018 CLINICAL DATA:  Small-bowel obstruction EXAM: PORTABLE ABDOMEN - 1 VIEW COMPARISON:  02/06/2018 FINDINGS: Dilated central abdominal loops of bowel measure up to about 4.6 cm in diameter, and are compatible with mild small bowel obstruction. The degree of small-bowel dilatation is increased from yesterday. Levoconvex lumbar scoliosis.  Distended stomach. IMPRESSION: 1. Increase in small bowel distension over the last day, currently moderate. The appearance is nonspecific but may well represent small bowel obstruction. The stomach is also somewhat distended. Electronically Signed   By: Van Clines M.D.   On: 02/07/2018 21:30   Dg Abd Portable 1v  Result Date: 02/04/2018 CLINICAL DATA:  Nausea vomiting.  Abdominal pain. EXAM: PORTABLE ABDOMEN - 1 VIEW COMPARISON:  None. FINDINGS: Mildly dilated loops of small bowel in the central to right lower abdomen. Some air and stool is noted in the rectum. No renal or ureteral stones. There are several phleboliths in the pelvis. Status post cholecystectomy. No acute skeletal abnormality. IMPRESSION: 1. Mild dilation of small bowel loops. Findings may reflect an adynamic ileus or a low grade partial obstruction. Electronically Signed   By: Lajean Manes M.D.   On: 02/04/2018 18:47   Ct Head Code Stroke Wo Contrast  Result Date: 01/24/2018 CLINICAL DATA:  Code stroke. 84 y/o F;  slurred speech, weakness, confusion. EXAM: CT HEAD WITHOUT CONTRAST TECHNIQUE: Contiguous axial images were obtained from the base of the skull through the vertex without intravenous contrast. COMPARISON:  07/27/2017 CT head. FINDINGS: Brain: No evidence of acute infarction, hemorrhage, hydrocephalus, extra-axial collection or mass lesion/mass effect. Partially empty sella turcica. New tiny lucency within the left posterior frontal periventricular white matter compatible with chronic microvascular ischemic change. Additional chronic microvascular ischemic changes and parenchymal volume loss of the brain on prior CT of head are stable. Vascular: Calcific atherosclerosis of carotid siphons. No hyperdense vessel identified. Skull: Normal. Negative for fracture or focal lesion. Sinuses/Orbits: No acute finding. Other: Bilateral intra-ocular lens replacement. ASPECTS [facility] Stroke Program Early CT Score) - Ganglionic level infarction (caudate, lentiform nuclei, internal capsule, insula, M1-M3  cortex): 7 - Supraganglionic infarction (M4-M6 cortex): 3 Total score (0-10 with 10 being normal): 10 IMPRESSION: 1. No acute intracranial abnormality identified. 2. ASPECTS is 10. 3. Chronic microvascular ischemic changes and parenchymal volume loss of the brain. These results were called by telephone at the time of interpretation on 01/27/2018 at 5:30 pm to Dr. Larae Grooms , who verbally acknowledged these results. Electronically Signed   By: Kristine Garbe M.D.   On: 02/14/2018 17:32    Microbiology: Recent Results (from the past 240 hour(s))  Culture, Urine     Status: None   Collection Time: 02/02/18  5:48 PM  Result Value Ref Range Status   Specimen Description URINE, CLEAN CATCH  Final   Special Requests NONE  Final   Culture   Final    NO GROWTH Performed at [facility], [address] [place], [place] [address]    Report Status 02/03/2018 FINAL  Final  Culture, blood (Routine X 2) w  Reflex to ID Panel     Status: None   Collection Time: 02/04/18  8:40 AM  Result Value Ref Range Status   Specimen Description BLOOD RIGHT HAND  Final   Special Requests   Final    BOTTLES DRAWN AEROBIC AND ANAEROBIC Blood Culture adequate volume   Culture   Final    NO GROWTH 5 DAYS Performed at [facility], [place] [address] [place], [place] [address]    Report Status 02/09/2018 FINAL  Final  Culture, blood (Routine X 2) w Reflex to ID Panel     Status: None   Collection Time: 02/04/18  8:41 AM  Result Value Ref Range Status   Specimen Description BLOOD RIGHT HAND  Final   Special Requests   Final    BOTTLES DRAWN AEROBIC AND ANAEROBIC Blood Culture adequate volume   Culture   Final    NO GROWTH 5 DAYS Performed at [facility], [place] [address] [place], [place] [address]    Report Status 02/09/2018 FINAL  Final     Labs: Basic Metabolic Panel: Recent Labs  Lab 02/06/18 0453 02/07/18 0504 02/09/18 0341 02/10/18 0849  NA 142 139 144 141  K 3.7 3.7 4.2 4.3  CL 107 109 111 111  CO2 28 25 24  20*  GLUCOSE 117* 103* 112* 156*  BUN 26* 23 25* 17  CREATININE 1.20* 1.00 1.05* 0.89  CALCIUM 8.2* 8.3* 8.2* 7.8*   Liver Function Tests: No results for input(s): AST, ALT, ALKPHOS, BILITOT, PROT, ALBUMIN in the last 168 hours. No results for input(s): LIPASE, AMYLASE in the last 168 hours. No results for input(s): AMMONIA in the last 168 hours. CBC: Recent Labs  Lab 02/06/18 0453 02/07/18 0504 02/08/18 0347 02/09/18 0341 02/10/18 0849 02/11/18 0325  WBC 20.6* 16.4* 32.8* 23.2* 33.4* 34.6*  NEUTROABS 7.0 6.1 11.5* 7.7  --   --   HGB 11.5* 11.2* 13.0 11.5* 8.6* 7.1*  HCT 35.4* 34.2* 39.4 36.0 27.6* 22.6*  MCV 95.9 95.5 95.2 96.3 99.6 99.6  PLT 223 186 291 249 274 258   Cardiac Enzymes: No results for input(s): CKTOTAL, CKMB, CKMBINDEX, TROPONINI in the last 168 hours. D-Dimer No results for input(s): DDIMER in the last 72 hours. BNP: Invalid input(s):  POCBNP CBG: Recent Labs  Lab 02/10/18 1150 02/10/18 1613 02/10/18 1928 02/10/18 2358 02/11/18 0458  GLUCAP 143* 137* 140* 117* 125*   Anemia work up No results for input(s): VITAMINB12, FOLATE, FERRITIN, TIBC, IRON, RETICCTPCT in  the last 72 hours. Urinalysis    Component Value Date/Time   COLORURINE AMBER (A) 02/03/2018 0841   APPEARANCEUR CLOUDY (A) 02/03/2018 0841   LABSPEC 1.023 02/03/2018 0841   PHURINE 5.0 02/03/2018 0841   GLUCOSEU NEGATIVE 02/03/2018 0841   HGBUR NEGATIVE 02/03/2018 0841   BILIRUBINUR NEGATIVE 02/03/2018 0841   KETONESUR 5 (A) 02/03/2018 0841   PROTEINUR NEGATIVE 02/03/2018 0841   NITRITE NEGATIVE 02/03/2018 0841   LEUKOCYTESUR MODERATE (A) 02/03/2018 0841   Sepsis Labs Invalid input(s): PROCALCITONIN,  WBC,  LACTICIDVEN     SIGNED:  Shelly Coss, MD  [facility] March 14, 2018, 11:37 AM Pager [phone]  If 7PM-7AM, please contact night-coverage [email or web address] Password [email or web address]

## 2018-02-22 DEATH — deceased

## 2020-01-24 IMAGING — RF DG ABDOMEN 1V
1 series · 1 of 1 positions shown · non-contrast
Comparison: None.

CLINICAL DATA: NG tube placement

EXAM:
ABDOMEN - 1 VIEW

[Series 1: cp_standard · 0.17mm/px · 1 of 1 slices shown]
[im 1/1]
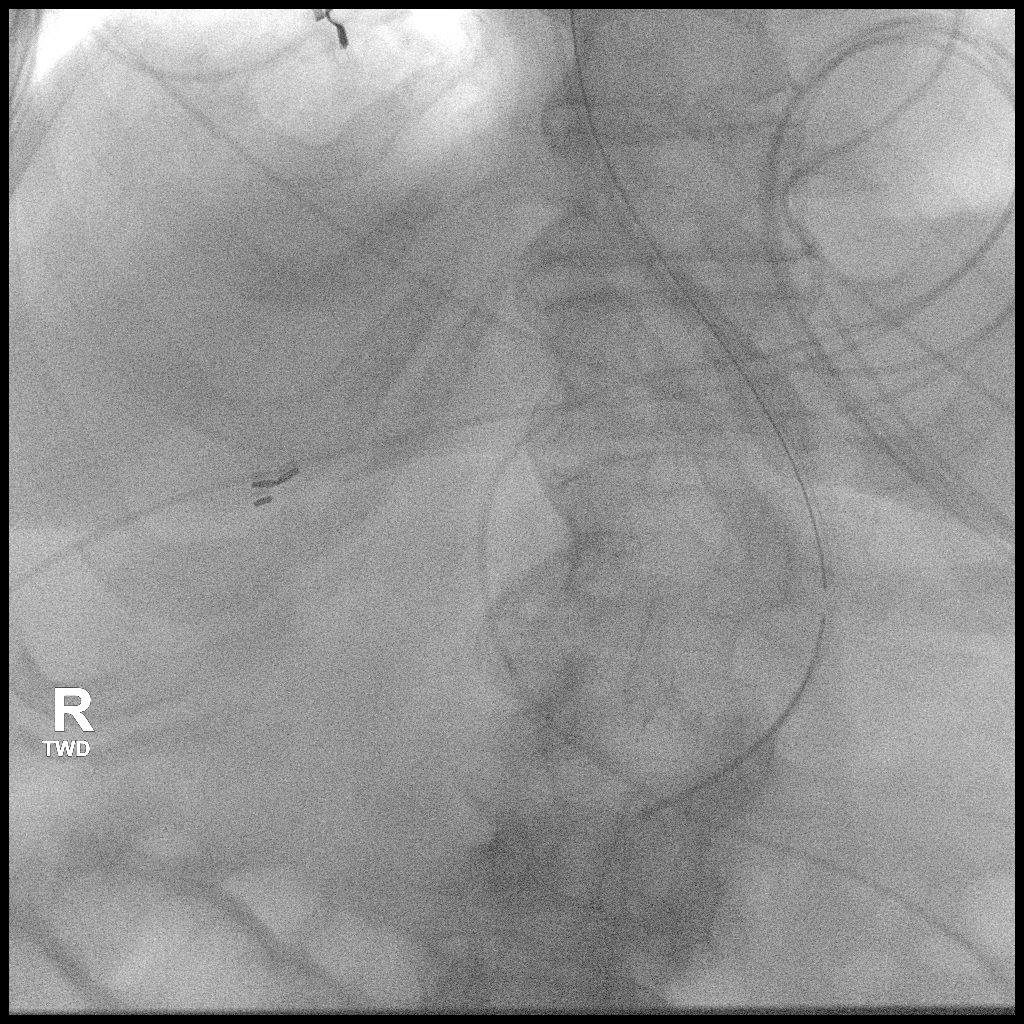

[1 of 1 positions shown; findings below may reference images not displayed]

FINDINGS: NG tube tip is in the mid to distal stomach.
IMPRESSION: NG tube tip in the mid to distal stomach.
